# Patient Record
Sex: Male | Born: 1955 | Race: White | Hispanic: No | Marital: Single | Smoking: Never smoker
Health system: Southern US, Community
[De-identification: ages and names within clinical notes are randomized; demographics above are authoritative.]

## PROBLEM LIST (undated history)

## (undated) DIAGNOSIS — E559 Vitamin D deficiency, unspecified: Secondary | ICD-10-CM

## (undated) DIAGNOSIS — M109 Gout, unspecified: Secondary | ICD-10-CM

## (undated) DIAGNOSIS — J45909 Unspecified asthma, uncomplicated: Secondary | ICD-10-CM

## (undated) DIAGNOSIS — E119 Type 2 diabetes mellitus without complications: Secondary | ICD-10-CM

## (undated) DIAGNOSIS — G4730 Sleep apnea, unspecified: Secondary | ICD-10-CM

## (undated) DIAGNOSIS — G4733 Obstructive sleep apnea (adult) (pediatric): Secondary | ICD-10-CM

## (undated) DIAGNOSIS — D649 Anemia, unspecified: Secondary | ICD-10-CM

## (undated) DIAGNOSIS — L719 Rosacea, unspecified: Secondary | ICD-10-CM

## (undated) DIAGNOSIS — F54 Psychological and behavioral factors associated with disorders or diseases classified elsewhere: Secondary | ICD-10-CM

## (undated) DIAGNOSIS — R631 Polydipsia: Secondary | ICD-10-CM

## (undated) DIAGNOSIS — F209 Schizophrenia, unspecified: Secondary | ICD-10-CM

## (undated) DIAGNOSIS — K219 Gastro-esophageal reflux disease without esophagitis: Secondary | ICD-10-CM

## (undated) DIAGNOSIS — F32A Depression, unspecified: Secondary | ICD-10-CM

## (undated) DIAGNOSIS — R1310 Dysphagia, unspecified: Secondary | ICD-10-CM

## (undated) DIAGNOSIS — G709 Myoneural disorder, unspecified: Secondary | ICD-10-CM

## (undated) DIAGNOSIS — I82890 Acute embolism and thrombosis of other specified veins: Secondary | ICD-10-CM

## (undated) DIAGNOSIS — E785 Hyperlipidemia, unspecified: Secondary | ICD-10-CM

## (undated) DIAGNOSIS — E039 Hypothyroidism, unspecified: Secondary | ICD-10-CM

## (undated) DIAGNOSIS — F329 Major depressive disorder, single episode, unspecified: Secondary | ICD-10-CM

## (undated) DIAGNOSIS — Z8719 Personal history of other diseases of the digestive system: Secondary | ICD-10-CM

## (undated) DIAGNOSIS — K2270 Barrett's esophagus without dysplasia: Secondary | ICD-10-CM

## (undated) DIAGNOSIS — IMO0001 Reserved for inherently not codable concepts without codable children: Secondary | ICD-10-CM

## (undated) DIAGNOSIS — R7989 Other specified abnormal findings of blood chemistry: Secondary | ICD-10-CM

## (undated) DIAGNOSIS — I8500 Esophageal varices without bleeding: Secondary | ICD-10-CM

## (undated) DIAGNOSIS — I10 Essential (primary) hypertension: Secondary | ICD-10-CM

## (undated) DIAGNOSIS — G4700 Insomnia, unspecified: Secondary | ICD-10-CM

## (undated) DIAGNOSIS — F200 Paranoid schizophrenia: Secondary | ICD-10-CM

## (undated) HISTORY — DX: Obstructive sleep apnea (adult) (pediatric): G47.33

## (undated) HISTORY — PX: EYE SURGERY: SHX253

## (undated) HISTORY — PX: TONSILLECTOMY: SUR1361

## (undated) HISTORY — PX: ESOPHAGOGASTRODUODENOSCOPY: SHX1529

## (undated) HISTORY — PX: COLONOSCOPY: SHX174

---

## 2005-01-17 ENCOUNTER — Ambulatory Visit: Payer: Self-pay | Admitting: Internal Medicine

## 2007-03-19 ENCOUNTER — Ambulatory Visit: Payer: Self-pay | Admitting: Otolaryngology

## 2007-03-19 ENCOUNTER — Other Ambulatory Visit: Payer: Self-pay

## 2007-03-26 ENCOUNTER — Ambulatory Visit: Payer: Self-pay | Admitting: Otolaryngology

## 2010-12-20 ENCOUNTER — Ambulatory Visit: Payer: Self-pay | Admitting: Gastroenterology

## 2011-03-26 ENCOUNTER — Ambulatory Visit: Payer: Self-pay | Admitting: Internal Medicine

## 2011-05-26 LAB — ACETAMINOPHEN LEVEL: Acetaminophen: <2 ug/mL

## 2011-05-26 LAB — COMPREHENSIVE METABOLIC PANEL
Albumin: 4.4 g/dL (ref 3.4–5.0)
Alkaline Phosphatase: 106 U/L (ref 50–136)
Bilirubin,Total: 0.4 mg/dL (ref 0.2–1.0)
Chloride: 85 mmol/L — ABNORMAL LOW (ref 98–107)
Creatinine: 0.88 mg/dL (ref 0.60–1.30)
Glucose: 258 mg/dL — ABNORMAL HIGH (ref 65–99)
Sodium: 125 mmol/L — ABNORMAL LOW (ref 136–145)
Total Protein: 7.4 g/dL (ref 6.4–8.2)

## 2011-05-26 LAB — DRUG SCREEN, URINE
Barbiturates, Ur Screen: NEGATIVE (ref ?–200)
Benzodiazepine, Ur Scrn: NEGATIVE (ref ?–200)
Cannabinoid 50 Ng, Ur ~~LOC~~: NEGATIVE (ref ?–50)
MDMA (Ecstasy)Ur Screen: NEGATIVE (ref ?–500)
Opiate, Ur Screen: NEGATIVE (ref ?–300)
Phencyclidine (PCP) Ur S: NEGATIVE (ref ?–25)

## 2011-05-26 LAB — CBC
HCT: 38.7 % — ABNORMAL LOW (ref 40.0–52.0)
MCH: 29.5 pg (ref 26.0–34.0)
MCHC: 32.9 g/dL (ref 32.0–36.0)
MCV: 90 fL (ref 80–100)
RBC: 4.31 10*6/uL — ABNORMAL LOW (ref 4.40–5.90)
RDW: 14.5 % (ref 11.5–14.5)

## 2011-05-26 LAB — ETHANOL
Ethanol %: <0.003 % (ref 0.000–0.080)
Ethanol: <3 mg/dL

## 2011-05-26 LAB — SALICYLATE LEVEL: Salicylates, Serum: <1.7 mg/dL

## 2011-05-28 ENCOUNTER — Inpatient Hospital Stay: Payer: Self-pay | Admitting: Psychiatry

## 2011-06-02 LAB — BASIC METABOLIC PANEL
Anion Gap: 11 (ref 7–16)
BUN: 13 mg/dL (ref 7–18)
Calcium, Total: 8.5 mg/dL (ref 8.5–10.1)
Chloride: 92 mmol/L — ABNORMAL LOW (ref 98–107)
Co2: 28 mmol/L (ref 21–32)
Creatinine: 0.94 mg/dL (ref 0.60–1.30)
EGFR (African American): >60
Sodium: 131 mmol/L — ABNORMAL LOW (ref 136–145)

## 2011-06-03 LAB — BASIC METABOLIC PANEL
BUN: 17 mg/dL (ref 7–18)
Calcium, Total: 9.6 mg/dL (ref 8.5–10.1)
Chloride: 96 mmol/L — ABNORMAL LOW (ref 98–107)
Creatinine: 0.88 mg/dL (ref 0.60–1.30)
Osmolality: 278 (ref 275–301)
Potassium: 4.3 mmol/L (ref 3.5–5.1)

## 2011-06-03 LAB — GLUCOSE, RANDOM
Glucose: 421 mg/dL — ABNORMAL HIGH (ref 65–99)
Glucose: 447 mg/dL — ABNORMAL HIGH (ref 65–99)

## 2011-06-11 LAB — COMPREHENSIVE METABOLIC PANEL
Albumin: 3.5 g/dL (ref 3.4–5.0)
BUN: 12 mg/dL (ref 7–18)
Bilirubin,Total: 0.3 mg/dL (ref 0.2–1.0)
Chloride: 77 mmol/L — ABNORMAL LOW (ref 98–107)
Co2: 26 mmol/L (ref 21–32)
Creatinine: 0.69 mg/dL (ref 0.60–1.30)
EGFR (African American): >60
EGFR (Non-African Amer.): >60
Glucose: 272 mg/dL — ABNORMAL HIGH (ref 65–99)
Osmolality: 240 (ref 275–301)
Potassium: 4.6 mmol/L (ref 3.5–5.1)
SGPT (ALT): 67 U/L
Sodium: 114 mmol/L — CL (ref 136–145)
Total Protein: 6.9 g/dL (ref 6.4–8.2)

## 2011-06-11 LAB — CBC
HCT: 35.1 % — ABNORMAL LOW (ref 40.0–52.0)
MCV: 88 fL (ref 80–100)
Platelet: 262 10*3/uL (ref 150–440)
RDW: 14.6 % — ABNORMAL HIGH (ref 11.5–14.5)
WBC: 9 10*3/uL (ref 3.8–10.6)

## 2011-06-11 LAB — SODIUM, URINE, RANDOM: Sodium, Urine Random: 50 mmol/L (ref 20–110)

## 2011-06-11 LAB — SODIUM: Sodium: 122 mmol/L — ABNORMAL LOW (ref 136–145)

## 2011-06-12 LAB — BASIC METABOLIC PANEL
Calcium, Total: 8.5 mg/dL (ref 8.5–10.1)
Chloride: 89 mmol/L — ABNORMAL LOW (ref 98–107)
Co2: 27 mmol/L (ref 21–32)
Creatinine: 0.82 mg/dL (ref 0.60–1.30)
Glucose: 231 mg/dL — ABNORMAL HIGH (ref 65–99)
Potassium: 4.7 mmol/L (ref 3.5–5.1)
Sodium: 126 mmol/L — ABNORMAL LOW (ref 136–145)

## 2011-06-13 LAB — BASIC METABOLIC PANEL
Calcium, Total: 8.7 mg/dL (ref 8.5–10.1)
Chloride: 86 mmol/L — ABNORMAL LOW (ref 98–107)
EGFR (Non-African Amer.): >60
Glucose: 211 mg/dL — ABNORMAL HIGH (ref 65–99)
Osmolality: 257 (ref 275–301)
Potassium: 5.4 mmol/L — ABNORMAL HIGH (ref 3.5–5.1)
Sodium: 124 mmol/L — ABNORMAL LOW (ref 136–145)

## 2011-06-28 ENCOUNTER — Emergency Department: Payer: Self-pay | Admitting: Emergency Medicine

## 2011-06-29 ENCOUNTER — Emergency Department: Payer: Self-pay | Admitting: Emergency Medicine

## 2011-06-29 LAB — CBC
HCT: 36.6 % — ABNORMAL LOW (ref 40.0–52.0)
MCH: 29 pg (ref 26.0–34.0)
MCV: 88 fL (ref 80–100)
Platelet: 198 10*3/uL (ref 150–440)
RDW: 14.6 % — ABNORMAL HIGH (ref 11.5–14.5)
WBC: 8.5 10*3/uL (ref 3.8–10.6)

## 2011-06-29 LAB — COMPREHENSIVE METABOLIC PANEL
Alkaline Phosphatase: 136 U/L (ref 50–136)
Anion Gap: 9 (ref 7–16)
BUN: 11 mg/dL (ref 7–18)
Bilirubin,Total: 0.5 mg/dL (ref 0.2–1.0)
Calcium, Total: 8.6 mg/dL (ref 8.5–10.1)
Co2: 26 mmol/L (ref 21–32)
Creatinine: 0.65 mg/dL (ref 0.60–1.30)
EGFR (African American): >60
EGFR (Non-African Amer.): >60
SGOT(AST): 40 U/L — ABNORMAL HIGH (ref 15–37)
SGPT (ALT): 30 U/L

## 2012-05-01 ENCOUNTER — Ambulatory Visit: Payer: Self-pay | Admitting: Gastroenterology

## 2012-08-26 ENCOUNTER — Ambulatory Visit: Payer: Self-pay | Admitting: Gastroenterology

## 2013-02-04 DIAGNOSIS — F200 Paranoid schizophrenia: Secondary | ICD-10-CM | POA: Insufficient documentation

## 2013-02-05 DIAGNOSIS — K2270 Barrett's esophagus without dysplasia: Secondary | ICD-10-CM | POA: Insufficient documentation

## 2013-02-05 DIAGNOSIS — K861 Other chronic pancreatitis: Secondary | ICD-10-CM | POA: Insufficient documentation

## 2013-02-05 DIAGNOSIS — I10 Essential (primary) hypertension: Secondary | ICD-10-CM | POA: Insufficient documentation

## 2013-02-05 DIAGNOSIS — E119 Type 2 diabetes mellitus without complications: Secondary | ICD-10-CM | POA: Insufficient documentation

## 2013-02-05 DIAGNOSIS — E039 Hypothyroidism, unspecified: Secondary | ICD-10-CM | POA: Insufficient documentation

## 2013-03-25 ENCOUNTER — Ambulatory Visit: Payer: Self-pay | Admitting: Gastroenterology

## 2013-04-17 ENCOUNTER — Ambulatory Visit: Payer: Self-pay | Admitting: Gastroenterology

## 2013-08-24 ENCOUNTER — Ambulatory Visit: Payer: Self-pay | Admitting: Otolaryngology

## 2013-09-02 ENCOUNTER — Ambulatory Visit: Payer: Self-pay | Admitting: Internal Medicine

## 2013-09-30 ENCOUNTER — Ambulatory Visit: Payer: Self-pay | Admitting: Family Medicine

## 2013-12-22 ENCOUNTER — Ambulatory Visit: Payer: Self-pay | Admitting: Internal Medicine

## 2014-01-12 ENCOUNTER — Ambulatory Visit: Payer: Self-pay | Admitting: Gastroenterology

## 2014-01-12 LAB — PROTIME-INR
INR: 1
Prothrombin Time: 12.8 secs (ref 11.5–14.7)

## 2014-01-12 LAB — PLATELET COUNT: Platelet: 231 10*3/uL (ref 150–440)

## 2014-01-14 LAB — PATHOLOGY REPORT

## 2014-01-21 ENCOUNTER — Ambulatory Visit: Payer: Self-pay | Admitting: Internal Medicine

## 2014-03-02 ENCOUNTER — Ambulatory Visit: Payer: Self-pay | Admitting: Gastroenterology

## 2014-04-07 ENCOUNTER — Encounter: Payer: Self-pay | Admitting: Otolaryngology

## 2014-04-09 ENCOUNTER — Encounter: Payer: Self-pay | Admitting: Otolaryngology

## 2014-04-22 DIAGNOSIS — K868 Other specified diseases of pancreas: Secondary | ICD-10-CM | POA: Diagnosis not present

## 2014-04-23 DIAGNOSIS — Z79899 Other long term (current) drug therapy: Secondary | ICD-10-CM | POA: Diagnosis not present

## 2014-04-26 DIAGNOSIS — Z1389 Encounter for screening for other disorder: Secondary | ICD-10-CM | POA: Diagnosis not present

## 2014-04-26 DIAGNOSIS — Z79899 Other long term (current) drug therapy: Secondary | ICD-10-CM | POA: Diagnosis not present

## 2014-05-18 DIAGNOSIS — Z79899 Other long term (current) drug therapy: Secondary | ICD-10-CM | POA: Diagnosis not present

## 2014-05-21 DIAGNOSIS — K861 Other chronic pancreatitis: Secondary | ICD-10-CM | POA: Diagnosis not present

## 2014-05-21 DIAGNOSIS — F200 Paranoid schizophrenia: Secondary | ICD-10-CM | POA: Diagnosis not present

## 2014-05-21 DIAGNOSIS — E118 Type 2 diabetes mellitus with unspecified complications: Secondary | ICD-10-CM | POA: Diagnosis not present

## 2014-05-25 DIAGNOSIS — F200 Paranoid schizophrenia: Secondary | ICD-10-CM | POA: Diagnosis not present

## 2014-05-25 DIAGNOSIS — E118 Type 2 diabetes mellitus with unspecified complications: Secondary | ICD-10-CM | POA: Diagnosis not present

## 2014-05-25 DIAGNOSIS — K861 Other chronic pancreatitis: Secondary | ICD-10-CM | POA: Diagnosis not present

## 2014-05-25 DIAGNOSIS — E039 Hypothyroidism, unspecified: Secondary | ICD-10-CM | POA: Diagnosis not present

## 2014-06-14 DIAGNOSIS — Z79899 Other long term (current) drug therapy: Secondary | ICD-10-CM | POA: Diagnosis not present

## 2014-07-05 DIAGNOSIS — G4722 Circadian rhythm sleep disorder, advanced sleep phase type: Secondary | ICD-10-CM | POA: Diagnosis not present

## 2014-07-05 DIAGNOSIS — J449 Chronic obstructive pulmonary disease, unspecified: Secondary | ICD-10-CM | POA: Diagnosis not present

## 2014-07-05 DIAGNOSIS — R911 Solitary pulmonary nodule: Secondary | ICD-10-CM | POA: Diagnosis not present

## 2014-07-05 DIAGNOSIS — K2270 Barrett's esophagus without dysplasia: Secondary | ICD-10-CM | POA: Diagnosis not present

## 2014-07-08 DIAGNOSIS — Z79899 Other long term (current) drug therapy: Secondary | ICD-10-CM | POA: Diagnosis not present

## 2014-07-09 DIAGNOSIS — E119 Type 2 diabetes mellitus without complications: Secondary | ICD-10-CM | POA: Diagnosis not present

## 2014-07-29 DIAGNOSIS — Z79899 Other long term (current) drug therapy: Secondary | ICD-10-CM | POA: Diagnosis not present

## 2014-08-01 NOTE — Consult Note (Signed)
PATIENT NAME:  Dalton Thomas, Dalton Thomas MR#:  [id number] DATE OF BIRTH:  23-Sep-1955  DATE OF CONSULTATION:  06/02/2011  REFERRING PHYSICIAN:  Caryn Section, MD CONSULTING PHYSICIAN:  Shaune Pollack, MD  PRIMARY CARE PHYSICIAN:  Dr. Evelene Croon.  REASON FOR CONSULTATION: Elevated blood sugar.   HISTORY: The patient is a 59 year old Caucasian male with a history of diabetes, schizophrenia, was admitted for paranoid schizophrenia and is being treated in psych unit. The patient was noted to have a high blood sugar about 400 to 500. The patient's blood sugar after lunchtime today is about 250. The patient has no complaints He denies any polyuria or polydipsia, urinary frequency. No heat or cold intolerance. No chest pain, palpitations, orthopnea. No exertional dyspnea. No weakness.   PAST MEDICAL HISTORY:   1. Diabetes. 2. Schizophrenia. 3. Possible hypothyroidism due to the patient is taking levothyroxine, but the patient denies.   PAST SURGICAL HISTORY:  None.   FAMILY HISTORY: Diabetes.   SOCIAL HISTORY: Denies any smoking, alcohol drinking, or illicit drugs.   CURRENT MEDICATIONS:  1. Glyburide/metformin 5/500 mg p.o. daily.  2. Januvia 100 mg p.o. daily.  3. Aspirin 81 mg p.o. daily.  4. Nexium 40 mg p.o. daily.  5. Levothyroxine 0.05 mg p.o. q. 6 a.m.  6. Metronidazole 0.75% gel apply to affected area.  7. Trazodone 100 mg p.o. at bedtime.  8. Acetaminophen, Tylenol #3, 650 mg p.o. 4 hours p.r.n. for pain and headache.  9. Alum-Mag hydrox with simethicone 400/400/40 milligrams/5 mL suspension, 30 mL p.o. every four hours p.r.n. for indigestion, heartburn.  10. Magnesium hydroxide suspension 30 mL p.o. at bedtime p.r.n. for constipation.  11. Geodon 80 mg p.o. b.i.d.  12. Petrolatum ointment apply to affected area once. 13. Risperdal 2 mg p.o. b.i.d.  14. Restoril 15 mg p.o. at bedtime. 15. Sliding scale.   REVIEW OF SYSTEMS: CONSTITUTIONAL: The patient denies any fever or chills. No  headache or dizziness. No weakness but has weight loss. HEENT: Pupils: No blurred vision, double vision. No glaucoma, no discharge from ear or nose. No epistaxis. CARDIOVASCULAR: No chest pain, palpitation, orthopnea, or nocturnal dyspnea. No leg edema. GASTROINTESTINAL: No abdominal pain, nausea, vomiting, or diarrhea. No melena or bloody stool. PULMONARY: No cough, sputum, shortness of breath or hemoptysis. ENDOCRINE: No polyuria or polydipsia or hot or cold intolerance. GU: No dysuria, hematuria or incontinence. SKIN: No rash or jaundice. PSYCH: The patient denies any anxiety or depression.   PHYSICAL EXAMINATION:   VITAL SIGNS: Temperature 95.6, pulse 82, respirations 18, blood pressure 136/70.   GENERAL: The patient is alert, awake, oriented in no acute distress.   HEENT: Pupils are round, equal, reactive to light and accommodation. Clear oropharynx. Moist oral mucosa.   NECK: Supple. No JVD or carotid bruits. No lymphadenopathy. No thyromegaly.   CARDIOVASCULAR: S1, S2, regular rate and rhythm. No murmurs or gallops.   PULMONARY: Bilateral air entry. No wheezing or rales.   ABDOMEN: Soft. No distention or tenderness. No organomegaly. Bowel sounds present.   EXTREMITIES: No edema, clubbing, or cyanosis. No calf tenderness. Strong pedal pulses.   SKIN: No rash or jaundice. No bruises.   NEUROLOGY: Alert and oriented x3. No focal deficits. Power five out of five. Sensation intact. Deep tendon reflexes 2+.  LABORATORY, RADIOLOGICAL AND DIAGNOSTIC DATA: Glucose today 297 at 12:00 and 500 at 9:40. BMP shows glucose 446, BUN 13, creatinine 0.94, sodium 131, potassium 5.1, chloride 92, bicarbonate 28, anion gap 11.   IMPRESSION:  1. Uncontrolled  diabetes. 2. Hyperglycemia. 3. Hyponatremia due to hyperglycemia.   RECOMMENDATIONS: The patient has uncontrolled diabetes. I recommend continue sliding scale. Discontinue p.o. diabetes medications and start basal Lantus 16 units subcutaneous at  bedtime and we will adjust Lantus dose depending on the blood sugar. In addition, we will check hemoglobin A1c and also we will give D50 IV p.r.n. if blood sugar less than 60.    ____________________________ Shaune PollackQing Jillyn Stacey, MD qc:ap D: 06/02/2011 14:05:40 ET T: 06/02/2011 14:33:25 ET JOB#: [id number]  cc: Shaune PollackQing Regene Mccarthy, MD, <Dictator> Meindert A. Lacie ScottsNiemeyer, MD Shaune PollackQING Kaiyden Simkin MD ELECTRONICALLY SIGNED 06/03/2011 13:30

## 2014-08-01 NOTE — H&P (Signed)
PATIENT NAME:  Dalton Thomas, Dalton Thomas MR#:  [id number] DATE OF BIRTH:  [birth year]  DATE OF ADMISSION:  05/28/2011  REFERRING PHYSICIAN: Dr. Daryel November ATTENDING PHYSICIAN: Dr. Kristine Linea    IDENTIFYING DATA: Mr. Fauth is a 59 year old male with history of paranoid schizophrenia.   CHIEF COMPLAINT:  "I only agreed to three days, four nights and two meals."   HISTORY OF PRESENT ILLNESS: Mr. Harrower has been a resident of a group home doing very well since 1982 when he was released from [facility] after eight years of hospitalization. He started refusing his medication and became increasingly disorganized, paranoid and delusional. On 02/15 his parents who are guardians took him to see his psychiatrist, Dr. Sherin Quarry, who increased the dose of Geodon, however, the patient did not take any medication at the group home since. He was rather bizarre, disorganized, complaining of voices that he hears for 300 years, refusing food and medication. He was thought to the Emergency Room. In the Emergency Room he was pleasantly confused and with encouragement he took his medications as prescribed. There were no behavioral problems in the Emergency Room. He was transferred to [facility] Medicine Unit. There is no history of alcohol or substance abuse.   PAST PSYCHIATRIC HISTORY: We don't know any details but as above he was hospitalized at [facility] for 7 or 8 years and released in 1982. He has been maintained on a combination of Geodon, Celexa and trazodone in the community with excellent results up until recently. I do not know his history of medication. It is unclear whether he had suicide attempts in the past. Per parents there were none.   FAMILY PSYCHIATRIC HISTORY: None reported.   PAST MEDICAL HISTORY:  1. Diabetes.  2. Gastroesophageal reflux disease. 3. Hypothyroidism.   ALLERGIES: No known drug allergies.   MEDICATIONS ON ADMISSION:  1. Aspirin 81 mg daily.   2. Synthroid 50 mcg daily.  3. Januvia 100 mg daily.  4. Nexium 40 mg daily.  5. Glucovance 5/500 mg twice daily.  6. Celexa 20 mg daily.  7. Trazodone 100 mg at bedtime. 8. MetroGel 1% topical gel apply daily.  9. Geodon 40 mg twice daily.   SOCIAL HISTORY: As above, he is incompetent, his parents are his guardians. He lives in a group home, has been very successful there up until recently. He is disabled.   REVIEW OF SYSTEMS: CONSTITUTIONAL: No fevers or chills. Unknown weight changes. He seems very slender even though he has voracious appetite in the Emergency Room. EYES: No double or blurred vision. ENT: No hearing loss. RESPIRATORY: No shortness of breath or cough. CARDIOVASCULAR: No chest pain or orthopnea. GASTROINTESTINAL: No abdominal pain, nausea, vomiting, or diarrhea. GENITOURINARY: No incontinence or frequency. ENDOCRINE: No heat or cold intolerance. LYMPHATIC: No anemia or easy bruising. INTEGUMENTARY: No acne, rash. MUSCULOSKELETAL: No muscle or joint pain. NEUROLOGIC: No tingling or weakness. PSYCHIATRIC: See history of present illness for details.   PHYSICAL EXAMINATION:  VITAL SIGNS: Blood pressure 157/81, pulse 82, respirations 18, temperature 97.6   GENERAL: This is a emaciated male in no acute distress.   HEENT: The pupils are equal, round, and reactive to light. Sclera anicteric.   NECK: Supple. No thyromegaly.   LUNGS: Clear to auscultation.   HEART: Regular rhythm and rate. No murmurs, rubs, or gallops.   ABDOMEN: Soft, nontender, nondistended. Positive bowel sounds.   MUSCULOSKELETAL: Normal muscle strength in all extremities.   SKIN: No rashes or bruises.   LYMPHATIC:  No cervical adenopathy.   NEUROLOGIC: Cranial nerves II through XII are intact.   LABORATORY, DIAGNOSTIC AND RADIOLOGICAL DATA: Chemistries are within normal limits except for blood glucose 258, low sodium 125. Blood alcohol level zero. LFTs within normal limits. TSH 2.56. Urine tox screen  negative for substances. CBC within normal limits. Serum acetaminophen and salicylates are low.   MENTAL STATUS EXAMINATION ON ADMISSION: The patient is assessed in the Emergency Room. He is pleasant, polite but utterly confused, knows his name but nothing about place, time and situation. He is willing to negotiate with me about the length of stay and his food. He is wearing hospital scrubs. He is marginally groomed. He maintains good eye contact. His speech is slightly pressured. Mood is fine with odd affect. Thought processing is disorganized. He denies thoughts of hurting himself or others. He is delusional and paranoid. He appears to attend to internal stimuli. His cognition is impaired mostly due to psychotic disorganization. His insight and judgment are poor.   SUICIDE RISK ASSESSMENT ON ADMISSION: This is a patient with history of psychosis but reportedly no mood symptoms or suicidal ideation who is unable to take care of himself and requires support and supervision.   DIAGNOSES:  AXIS I: Paranoid schizophrenia.   AXIS II: Deferred.   AXIS III:  1. Hypertension. 2. Diabetes. 3. Gastroesophageal reflux disease.   AXIS IV: Mental illness, treatment compliance, exacerbation of psychosis.   AXIS V: GAF 25.   PLAN: The patient was admitted to [facility] Behavioral Medicine unit for safety, stabilization and medication management. He was initially placed on suicide precautions and was closely monitored for any unsafe behaviors. He underwent full psychiatric and risk assessment. He received pharmacotherapy, individual and group psychotherapy, substance abuse counseling, and support from therapeutic milieu.  1. Psychosis: We restarted him on Geodon. We increased the dose to 80 mg twice daily. He accepts medication and seems to tolerate it well.  2. Medical: We will continue all his medications as prescribed by his primary provider.  3. Disposition: To be established.    ____________________________ Ellin GoodieJolanta B. Jennet MaduroPucilowska, MD jbp:cms D: 05/29/2011 19:29:58 ET T: 05/30/2011 06:29:04 ET JOB#: [id number]  cc: Eual Lindstrom B. Jennet MaduroPucilowska, MD, <Dictator> Shari ProwsJOLANTA B Gill Delrossi MD ELECTRONICALLY SIGNED 06/02/2011 4:36

## 2014-08-01 NOTE — Consult Note (Signed)
Brief Consult Note: Diagnosis: Paranoid schizophrenia.   Comments: Will admit tp BMU.  Electronic Signatures: Kristine LineaPucilowska, Tytiana Coles (MD)  (Signed 18-Feb-13 17:01)  Authored: Brief Consult Note   Last Updated: 18-Feb-13 17:01 by Kristine LineaPucilowska, Shahed Yeoman (MD)

## 2014-08-01 NOTE — Consult Note (Signed)
Brief Consult Note: Diagnosis: Paranoid schizophrenia.   Comments: Will admitt to psychiatry.  Electronic Signatures: Kristine LineaPucilowska, Jolanta (MD)  (Signed 18-Feb-13 13:05)  Authored: Brief Consult Note   Last Updated: 18-Feb-13 13:05 by Kristine LineaPucilowska, Jolanta (MD)

## 2014-08-03 DIAGNOSIS — D509 Iron deficiency anemia, unspecified: Secondary | ICD-10-CM | POA: Diagnosis not present

## 2014-08-03 DIAGNOSIS — I10 Essential (primary) hypertension: Secondary | ICD-10-CM | POA: Diagnosis not present

## 2014-08-03 DIAGNOSIS — K2270 Barrett's esophagus without dysplasia: Secondary | ICD-10-CM | POA: Diagnosis not present

## 2014-08-03 DIAGNOSIS — E1169 Type 2 diabetes mellitus with other specified complication: Secondary | ICD-10-CM | POA: Diagnosis not present

## 2014-08-03 DIAGNOSIS — F209 Schizophrenia, unspecified: Secondary | ICD-10-CM | POA: Diagnosis not present

## 2014-08-04 DIAGNOSIS — H2589 Other age-related cataract: Secondary | ICD-10-CM | POA: Diagnosis not present

## 2014-08-05 DIAGNOSIS — K2270 Barrett's esophagus without dysplasia: Secondary | ICD-10-CM | POA: Diagnosis not present

## 2014-08-05 DIAGNOSIS — K861 Other chronic pancreatitis: Secondary | ICD-10-CM | POA: Diagnosis not present

## 2014-08-05 DIAGNOSIS — K5909 Other constipation: Secondary | ICD-10-CM | POA: Diagnosis not present

## 2014-08-06 DIAGNOSIS — M6281 Muscle weakness (generalized): Secondary | ICD-10-CM | POA: Diagnosis not present

## 2014-08-06 DIAGNOSIS — R262 Difficulty in walking, not elsewhere classified: Secondary | ICD-10-CM | POA: Diagnosis not present

## 2014-08-09 DIAGNOSIS — M6281 Muscle weakness (generalized): Secondary | ICD-10-CM | POA: Diagnosis not present

## 2014-08-09 DIAGNOSIS — R262 Difficulty in walking, not elsewhere classified: Secondary | ICD-10-CM | POA: Diagnosis not present

## 2014-08-11 DIAGNOSIS — R262 Difficulty in walking, not elsewhere classified: Secondary | ICD-10-CM | POA: Diagnosis not present

## 2014-08-11 DIAGNOSIS — M6281 Muscle weakness (generalized): Secondary | ICD-10-CM | POA: Diagnosis not present

## 2014-08-16 DIAGNOSIS — R262 Difficulty in walking, not elsewhere classified: Secondary | ICD-10-CM | POA: Diagnosis not present

## 2014-08-16 DIAGNOSIS — E785 Hyperlipidemia, unspecified: Secondary | ICD-10-CM | POA: Diagnosis not present

## 2014-08-16 DIAGNOSIS — I2510 Atherosclerotic heart disease of native coronary artery without angina pectoris: Secondary | ICD-10-CM | POA: Diagnosis not present

## 2014-08-16 DIAGNOSIS — R5383 Other fatigue: Secondary | ICD-10-CM | POA: Diagnosis not present

## 2014-08-16 DIAGNOSIS — I10 Essential (primary) hypertension: Secondary | ICD-10-CM | POA: Diagnosis not present

## 2014-08-16 DIAGNOSIS — M6281 Muscle weakness (generalized): Secondary | ICD-10-CM | POA: Diagnosis not present

## 2014-08-20 DIAGNOSIS — M6281 Muscle weakness (generalized): Secondary | ICD-10-CM | POA: Diagnosis not present

## 2014-08-20 DIAGNOSIS — R262 Difficulty in walking, not elsewhere classified: Secondary | ICD-10-CM | POA: Diagnosis not present

## 2014-08-25 DIAGNOSIS — E118 Type 2 diabetes mellitus with unspecified complications: Secondary | ICD-10-CM | POA: Diagnosis not present

## 2014-08-25 DIAGNOSIS — K861 Other chronic pancreatitis: Secondary | ICD-10-CM | POA: Diagnosis not present

## 2014-08-25 DIAGNOSIS — E039 Hypothyroidism, unspecified: Secondary | ICD-10-CM | POA: Diagnosis not present

## 2014-08-25 DIAGNOSIS — I10 Essential (primary) hypertension: Secondary | ICD-10-CM | POA: Diagnosis not present

## 2014-08-25 DIAGNOSIS — Z125 Encounter for screening for malignant neoplasm of prostate: Secondary | ICD-10-CM | POA: Diagnosis not present

## 2014-08-25 DIAGNOSIS — F200 Paranoid schizophrenia: Secondary | ICD-10-CM | POA: Diagnosis not present

## 2014-08-25 DIAGNOSIS — K2270 Barrett's esophagus without dysplasia: Secondary | ICD-10-CM | POA: Diagnosis not present

## 2014-08-25 DIAGNOSIS — D509 Iron deficiency anemia, unspecified: Secondary | ICD-10-CM | POA: Diagnosis not present

## 2014-08-25 DIAGNOSIS — E1165 Type 2 diabetes mellitus with hyperglycemia: Secondary | ICD-10-CM | POA: Diagnosis not present

## 2014-08-25 DIAGNOSIS — Z0001 Encounter for general adult medical examination with abnormal findings: Secondary | ICD-10-CM | POA: Diagnosis not present

## 2014-08-31 ENCOUNTER — Encounter
Admission: RE | Admit: 2014-08-31 | Discharge: 2014-08-31 | Disposition: A | Discharge status: Home / Self Care | Payer: Medicare Other | Source: Ambulatory Visit | Attending: Ophthalmology | Admitting: Ophthalmology

## 2014-08-31 DIAGNOSIS — I10 Essential (primary) hypertension: Secondary | ICD-10-CM | POA: Insufficient documentation

## 2014-08-31 DIAGNOSIS — H2589 Other age-related cataract: Secondary | ICD-10-CM | POA: Diagnosis not present

## 2014-08-31 DIAGNOSIS — R262 Difficulty in walking, not elsewhere classified: Secondary | ICD-10-CM | POA: Diagnosis not present

## 2014-08-31 DIAGNOSIS — Z01812 Encounter for preprocedural laboratory examination: Secondary | ICD-10-CM | POA: Diagnosis not present

## 2014-08-31 DIAGNOSIS — Z01810 Encounter for preprocedural cardiovascular examination: Secondary | ICD-10-CM | POA: Diagnosis not present

## 2014-08-31 DIAGNOSIS — M6281 Muscle weakness (generalized): Secondary | ICD-10-CM | POA: Diagnosis not present

## 2014-08-31 LAB — HEMOGLOBIN: Hemoglobin: 14 g/dL (ref 13.0–18.0)

## 2014-09-04 DIAGNOSIS — G4733 Obstructive sleep apnea (adult) (pediatric): Secondary | ICD-10-CM | POA: Diagnosis not present

## 2014-09-07 ENCOUNTER — Ambulatory Visit: Payer: Medicare Other | Admitting: Certified Registered"

## 2014-09-07 ENCOUNTER — Encounter
Admission: RE | Disposition: A | Discharge status: Home / Self Care | Payer: Self-pay | Source: Ambulatory Visit | Attending: Ophthalmology

## 2014-09-07 ENCOUNTER — Ambulatory Visit
Admission: RE | Admit: 2014-09-07 | Discharge: 2014-09-07 | Disposition: A | Discharge status: Home / Self Care | Payer: Medicare Other | Source: Ambulatory Visit | Attending: Ophthalmology | Admitting: Ophthalmology

## 2014-09-07 ENCOUNTER — Encounter: Payer: Self-pay | Admitting: *Deleted

## 2014-09-07 DIAGNOSIS — I10 Essential (primary) hypertension: Secondary | ICD-10-CM | POA: Insufficient documentation

## 2014-09-07 DIAGNOSIS — E119 Type 2 diabetes mellitus without complications: Secondary | ICD-10-CM | POA: Insufficient documentation

## 2014-09-07 DIAGNOSIS — F209 Schizophrenia, unspecified: Secondary | ICD-10-CM | POA: Diagnosis not present

## 2014-09-07 DIAGNOSIS — H2589 Other age-related cataract: Secondary | ICD-10-CM | POA: Diagnosis not present

## 2014-09-07 DIAGNOSIS — G4730 Sleep apnea, unspecified: Secondary | ICD-10-CM | POA: Diagnosis not present

## 2014-09-07 DIAGNOSIS — M1990 Unspecified osteoarthritis, unspecified site: Secondary | ICD-10-CM | POA: Insufficient documentation

## 2014-09-07 DIAGNOSIS — D649 Anemia, unspecified: Secondary | ICD-10-CM | POA: Diagnosis not present

## 2014-09-07 DIAGNOSIS — L719 Rosacea, unspecified: Secondary | ICD-10-CM | POA: Diagnosis not present

## 2014-09-07 DIAGNOSIS — H2511 Age-related nuclear cataract, right eye: Secondary | ICD-10-CM | POA: Diagnosis not present

## 2014-09-07 DIAGNOSIS — K861 Other chronic pancreatitis: Secondary | ICD-10-CM | POA: Diagnosis not present

## 2014-09-07 DIAGNOSIS — F329 Major depressive disorder, single episode, unspecified: Secondary | ICD-10-CM | POA: Insufficient documentation

## 2014-09-07 DIAGNOSIS — K219 Gastro-esophageal reflux disease without esophagitis: Secondary | ICD-10-CM | POA: Insufficient documentation

## 2014-09-07 DIAGNOSIS — R1310 Dysphagia, unspecified: Secondary | ICD-10-CM | POA: Insufficient documentation

## 2014-09-07 DIAGNOSIS — R631 Polydipsia: Secondary | ICD-10-CM | POA: Insufficient documentation

## 2014-09-07 DIAGNOSIS — K2270 Barrett's esophagus without dysplasia: Secondary | ICD-10-CM | POA: Diagnosis not present

## 2014-09-07 DIAGNOSIS — E780 Pure hypercholesterolemia: Secondary | ICD-10-CM | POA: Diagnosis not present

## 2014-09-07 DIAGNOSIS — E039 Hypothyroidism, unspecified: Secondary | ICD-10-CM | POA: Insufficient documentation

## 2014-09-07 HISTORY — DX: Major depressive disorder, single episode, unspecified: F32.9

## 2014-09-07 HISTORY — DX: Reserved for inherently not codable concepts without codable children: IMO0001

## 2014-09-07 HISTORY — DX: Myoneural disorder, unspecified: G70.9

## 2014-09-07 HISTORY — DX: Depression, unspecified: F32.A

## 2014-09-07 HISTORY — DX: Sleep apnea, unspecified: G47.30

## 2014-09-07 HISTORY — DX: Essential (primary) hypertension: I10

## 2014-09-07 HISTORY — PX: CATARACT EXTRACTION W/PHACO: SHX586

## 2014-09-07 HISTORY — DX: Hypothyroidism, unspecified: E03.9

## 2014-09-07 HISTORY — DX: Gastro-esophageal reflux disease without esophagitis: K21.9

## 2014-09-07 SURGERY — PHACOEMULSIFICATION, CATARACT, WITH IOL INSERTION
Anesthesia: Monitor Anesthesia Care | Site: Eye | Laterality: Right | Wound class: Clean

## 2014-09-07 MED ORDER — POVIDONE-IODINE 5 % OP SOLN
OPHTHALMIC | Status: AC
  Administered 2014-09-07: 1 via OPHTHALMIC
  Filled 2014-09-07: qty 30

## 2014-09-07 MED ORDER — TRYPAN BLUE 0.06 % OP SOLN
OPHTHALMIC | Status: AC
  Filled 2014-09-07: qty 0.5

## 2014-09-07 MED ORDER — CARBACHOL 0.01 % IO SOLN
INTRAOCULAR | Status: DC | PRN
  Administered 2014-09-07: 0.5 mL via INTRAOCULAR

## 2014-09-07 MED ORDER — SODIUM CHLORIDE 0.9 % IV SOLN
INTRAVENOUS | Status: DC
  Administered 2014-09-07: 11:00:00 via INTRAVENOUS

## 2014-09-07 MED ORDER — MOXIFLOXACIN HCL 0.5 % OP SOLN - NO CHARGE
OPHTHALMIC | Status: DC | PRN
  Administered 2014-09-07: 1 [drp]

## 2014-09-07 MED ORDER — MIDAZOLAM HCL 2 MG/2ML IJ SOLN
INTRAMUSCULAR | Status: DC | PRN
  Administered 2014-09-07: 2 mg via INTRAVENOUS

## 2014-09-07 MED ORDER — MOXIFLOXACIN HCL 0.5 % OP SOLN
OPHTHALMIC | Status: AC
  Filled 2014-09-07: qty 3

## 2014-09-07 MED ORDER — EPINEPHRINE HCL 1 MG/ML IJ SOLN
INTRAMUSCULAR | Status: AC
Start: 2014-09-07 — End: 2014-09-07
  Filled 2014-09-07: qty 2

## 2014-09-07 MED ORDER — CEFUROXIME OPHTHALMIC INJECTION 1 MG/0.1 ML
INJECTION | OPHTHALMIC | Status: DC | PRN
  Administered 2014-09-07: 0.1 mL via INTRACAMERAL

## 2014-09-07 MED ORDER — EPINEPHRINE HCL 1 MG/ML IJ SOLN
INTRAOCULAR | Status: DC | PRN
  Administered 2014-09-07: 200 mL

## 2014-09-07 MED ORDER — TETRACAINE HCL 0.5 % OP SOLN
OPHTHALMIC | Status: AC
  Administered 2014-09-07: 1 [drp] via OPHTHALMIC
  Filled 2014-09-07: qty 2

## 2014-09-07 MED ORDER — ARMC OPHTHALMIC DILATING GEL
OPHTHALMIC | Status: AC
  Administered 2014-09-07: 2 via OPHTHALMIC
  Filled 2014-09-07: qty 0.25

## 2014-09-07 MED ORDER — CEFUROXIME OPHTHALMIC INJECTION 1 MG/0.1 ML
INJECTION | OPHTHALMIC | Status: AC
  Filled 2014-09-07: qty 0.1

## 2014-09-07 MED ORDER — LIDOCAINE HCL (PF) 4 % IJ SOLN
INTRAMUSCULAR | Status: AC
  Filled 2014-09-07: qty 5

## 2014-09-07 MED ORDER — NA CHONDROIT SULF-NA HYALURON 40-17 MG/ML IO SOLN
INTRAOCULAR | Status: AC
  Filled 2014-09-07: qty 1

## 2014-09-07 MED ORDER — EPINEPHRINE HCL 1 MG/ML IJ SOLN
INTRAMUSCULAR | Status: DC | PRN
  Administered 2014-09-07: 12:00:00 via OPHTHALMIC

## 2014-09-07 SURGICAL SUPPLY — 23 items
ACTIVE FMS ×3 IMPLANT
CANNULA ANT/CHMB 27GA (MISCELLANEOUS) ×9 IMPLANT
GLOVE BIO SURGEON STRL SZ8 (GLOVE) ×3 IMPLANT
GLOVE BIOGEL M 6.5 STRL (GLOVE) ×3 IMPLANT
GLOVE SURG LX 8.0 MICRO (GLOVE) ×2
GLOVE SURG LX STRL 8.0 MICRO (GLOVE) ×1 IMPLANT
GOWN STRL REUS W/ TWL LRG LVL3 (GOWN DISPOSABLE) ×2 IMPLANT
GOWN STRL REUS W/TWL LRG LVL3 (GOWN DISPOSABLE) ×4
LENS IOL TECNIS 21 (Intraocular Lens) ×1 IMPLANT
LENS IOL TECNIS 21.0 (Intraocular Lens) ×2 IMPLANT
LENS IOL TECNIS MONO 1P 21.0 (Intraocular Lens) ×1 IMPLANT
PACK CATARACT (MISCELLANEOUS) ×3 IMPLANT
PACK CATARACT BRASINGTON LX (MISCELLANEOUS) ×3 IMPLANT
PACK EYE AFTER SURG (MISCELLANEOUS) ×3 IMPLANT
SOL BSS BAG (MISCELLANEOUS) ×3
SOL PREP PVP 2OZ (MISCELLANEOUS) ×3
SOLUTION BSS BAG (MISCELLANEOUS) ×1 IMPLANT
SOLUTION PREP PVP 2OZ (MISCELLANEOUS) ×1 IMPLANT
SYR 3ML LL SCALE MARK (SYRINGE) ×6 IMPLANT
SYR 5ML LL (SYRINGE) ×3 IMPLANT
SYR TB 1ML 27GX1/2 LL (SYRINGE) ×3 IMPLANT
WATER STERILE IRR 1000ML POUR (IV SOLUTION) ×3 IMPLANT
WIPE NON LINTING 3.25X3.25 (MISCELLANEOUS) ×3 IMPLANT

## 2014-09-07 NOTE — Discharge Instructions (Signed)
See cataract post op handout  ° °

## 2014-09-07 NOTE — Anesthesia Postprocedure Evaluation (Signed)
  Anesthesia Post-op Note  Patient: Dalton HammedFred B Taketa Jr.  Procedure(s) Performed: Procedure(s) with comments: CATARACT EXTRACTION PHACO AND INTRAOCULAR LENS PLACEMENT (IOC) (Right) - US 02:16 AP% 24.8 CDE 33.94  Anesthesia type:MAC  Patient location: PACU  Post pain: Pain level controlled  Post assessment: Post-op Vital signs reviewed, Patient's Cardiovascular Status Stable, Respiratory Function Stable, Patent Airway and No signs of Nausea or vomiting  Post vital signs: Reviewed and stable  Last Vitals:  Filed Vitals:   09/07/14 1153  BP: 117/69  Pulse: 61  Temp: 35.8 C  Resp: 18    Level of consciousness: awake, alert  and patient cooperative  Complications: No apparent anesthesia complications

## 2014-09-07 NOTE — Transfer of Care (Signed)
Immediate Anesthesia Transfer of Care Note  Patient: Dalton HammedFred B Kressin Jr.  Procedure(s) Performed: Procedure(s) with comments: CATARACT EXTRACTION PHACO AND INTRAOCULAR LENS PLACEMENT (IOC) (Right) - US 02:16 AP% 24.8 CDE 33.94  Patient Location: Short Stay  Anesthesia Type:MAC  Level of Consciousness: awake  Airway & Oxygen Therapy: Patient Spontanous Breathing  Post-op Assessment: Report given to RN  Post vital signs: Reviewed  Last Vitals:  Filed Vitals:   09/07/14 1153  BP: 117/69  Pulse: 61  Temp: 35.8 C  Resp: 18    Complications: No apparent anesthesia complications

## 2014-09-07 NOTE — Anesthesia Preprocedure Evaluation (Signed)
Anesthesia Evaluation  Patient identified by MRN, date of birth, ID band Patient awake    Reviewed: Allergy & Precautions, NPO status , Unable to perform ROS - Chart review only  Airway Mallampati: III  TM Distance: >3 FB Neck ROM: Full    Dental  (+) Chipped, Partial Upper   Pulmonary shortness of breath and with exertion, sleep apnea ,  breath sounds clear to auscultation  Pulmonary exam normal       Cardiovascular hypertension, Pt. on medications Rhythm:Regular Rate:Normal     Neuro/Psych Depression ? Neurologic Dz . Pt just states cataract    GI/Hepatic GERD-  Controlled,  Endo/Other  Hypothyroidism   Renal/GU Renal disease  negative genitourinary   Musculoskeletal negative musculoskeletal ROS (+)   Abdominal Normal abdominal exam  (+)   Peds negative pediatric ROS (+)  Hematology negative hematology ROS (+)   Anesthesia Other Findings Med list includes:   Melatonin, Trazodone, clozapine, ss insulin, simvastatin, lopressor  Reproductive/Obstetrics negative OB ROS                             Anesthesia Physical Anesthesia Plan  ASA: III  Anesthesia Plan: MAC   Post-op Pain Management:    Induction: Intravenous  Airway Management Planned: Nasal Cannula  Additional Equipment:   Intra-op Plan:   Post-operative Plan:   Informed Consent: I have reviewed the patients History and Physical, chart, labs and discussed the procedure including the risks, benefits and alternatives for the proposed anesthesia with the patient or authorized representative who has indicated his/her understanding and acceptance.   Dental advisory given  Plan Discussed with: CRNA and Surgeon  Anesthesia Plan Comments:         Anesthesia Quick Evaluation

## 2014-09-07 NOTE — Op Note (Signed)
PREOPERATIVE DIAGNOSIS:  Nuclear sclerotic cataract of the right eye.   POSTOPERATIVE DIAGNOSIS: same   OPERATIVE PROCEDURE:  Procedure(s): CATARACT EXTRACTION PHACO AND INTRAOCULAR LENS PLACEMENT (IOC)   SURGEON:  Dalton ManilaWilliam Jemarcus Dougal, MD.   ANESTHESIA:  Anesthesiologist: Yves DillPaul Carroll, MD CRNA: Mathews ArgyleBenjamin Logan, CRNA  1.      Managed anesthesia care. 2.      Topical tetracaine drops followed by 2% Xylocaine jelly applied in the preoperative holding area.   COMPLICATIONS:  None.   TECHNIQUE:   Stop and chop   DESCRIPTION OF PROCEDURE:  The patient was examined and consented in the preoperative holding area where the aforementioned topical anesthesia was applied to the right eye and then brought back to the Operating Room where the right eye was prepped and draped in the usual sterile ophthalmic fashion and a lid speculum was placed. A paracentesis was created with the side port blade and the anterior chamber was filled with viscoelastic. A near clear corneal incision was performed with the steel keratome. A continuous curvilinear capsulorrhexis was performed with a cystotome followed by the capsulorrhexis forceps. Hydrodissection and hydrodelineation were carried out with BSS on a blunt cannula. The lens was removed in a stop and chop  technique and the remaining cortical material was removed with the irrigation-aspiration handpiece. The capsular bag was inflated with viscoelastic and the Technis ZCB00  lens was placed in the capsular bag without complication. The remaining viscoelastic was removed from the eye with the irrigation-aspiration handpiece. The wounds were hydrated. The anterior chamber was flushed with Miostat and the eye was inflated to physiologic pressure. 0.1 mL of cefuroxime concentration 10 mg/mL was placed in the anterior chamber. The wounds were found to be water tight. The eye was dressed with Vigamox. The patient was given protective glasses to wear throughout the day and a  shield with which to sleep tonight. The patient was also given drops with which to begin a drop regimen today and will follow-up with me in one day.  Implant Name Type Inv. Item Serial No. Manufacturer Lot No. LRB No. Used  LENS IMPL INTRAOC ZCB00 21.0 - [id number] Intraocular Lens LENS IMPL INTRAOC ZCB00 21.0 [id number]   Right 1   Procedure(s) with comments: CATARACT EXTRACTION PHACO AND INTRAOCULAR LENS PLACEMENT (IOC) (Right) - US 02:16 AP% 24.8 CDE 33.94  Electronically signed: Demaris Leavell LOUIS 09/07/2014 11:50 AM

## 2014-09-17 DIAGNOSIS — G2581 Restless legs syndrome: Secondary | ICD-10-CM | POA: Diagnosis not present

## 2014-09-17 DIAGNOSIS — I2510 Atherosclerotic heart disease of native coronary artery without angina pectoris: Secondary | ICD-10-CM | POA: Diagnosis not present

## 2014-09-17 DIAGNOSIS — I10 Essential (primary) hypertension: Secondary | ICD-10-CM | POA: Diagnosis not present

## 2014-09-28 DIAGNOSIS — Z79899 Other long term (current) drug therapy: Secondary | ICD-10-CM | POA: Diagnosis not present

## 2014-10-07 DIAGNOSIS — M9903 Segmental and somatic dysfunction of lumbar region: Secondary | ICD-10-CM | POA: Diagnosis not present

## 2014-10-07 DIAGNOSIS — M5416 Radiculopathy, lumbar region: Secondary | ICD-10-CM | POA: Diagnosis not present

## 2014-10-07 DIAGNOSIS — I10 Essential (primary) hypertension: Secondary | ICD-10-CM | POA: Diagnosis not present

## 2014-10-07 DIAGNOSIS — E871 Hypo-osmolality and hyponatremia: Secondary | ICD-10-CM | POA: Diagnosis not present

## 2014-10-07 DIAGNOSIS — M9901 Segmental and somatic dysfunction of cervical region: Secondary | ICD-10-CM | POA: Diagnosis not present

## 2014-10-07 DIAGNOSIS — M5030 Other cervical disc degeneration, unspecified cervical region: Secondary | ICD-10-CM | POA: Diagnosis not present

## 2014-10-08 DIAGNOSIS — Z961 Presence of intraocular lens: Secondary | ICD-10-CM | POA: Diagnosis not present

## 2014-10-18 DIAGNOSIS — E785 Hyperlipidemia, unspecified: Secondary | ICD-10-CM | POA: Diagnosis not present

## 2014-10-18 DIAGNOSIS — I2510 Atherosclerotic heart disease of native coronary artery without angina pectoris: Secondary | ICD-10-CM | POA: Diagnosis not present

## 2014-10-18 DIAGNOSIS — I10 Essential (primary) hypertension: Secondary | ICD-10-CM | POA: Diagnosis not present

## 2014-10-28 DIAGNOSIS — Z79899 Other long term (current) drug therapy: Secondary | ICD-10-CM | POA: Diagnosis not present

## 2014-11-05 DIAGNOSIS — M5416 Radiculopathy, lumbar region: Secondary | ICD-10-CM | POA: Diagnosis not present

## 2014-11-05 DIAGNOSIS — M5030 Other cervical disc degeneration, unspecified cervical region: Secondary | ICD-10-CM | POA: Diagnosis not present

## 2014-11-05 DIAGNOSIS — M9903 Segmental and somatic dysfunction of lumbar region: Secondary | ICD-10-CM | POA: Diagnosis not present

## 2014-11-05 DIAGNOSIS — M9901 Segmental and somatic dysfunction of cervical region: Secondary | ICD-10-CM | POA: Diagnosis not present

## 2014-11-09 DIAGNOSIS — F200 Paranoid schizophrenia: Secondary | ICD-10-CM | POA: Diagnosis not present

## 2014-11-09 DIAGNOSIS — Z79899 Other long term (current) drug therapy: Secondary | ICD-10-CM | POA: Diagnosis not present

## 2014-11-19 DIAGNOSIS — Z79899 Other long term (current) drug therapy: Secondary | ICD-10-CM | POA: Diagnosis not present

## 2014-11-29 DIAGNOSIS — K861 Other chronic pancreatitis: Secondary | ICD-10-CM | POA: Diagnosis not present

## 2014-11-29 DIAGNOSIS — E118 Type 2 diabetes mellitus with unspecified complications: Secondary | ICD-10-CM | POA: Diagnosis not present

## 2014-11-29 DIAGNOSIS — I10 Essential (primary) hypertension: Secondary | ICD-10-CM | POA: Diagnosis not present

## 2014-11-29 DIAGNOSIS — F200 Paranoid schizophrenia: Secondary | ICD-10-CM | POA: Diagnosis not present

## 2014-11-29 DIAGNOSIS — E039 Hypothyroidism, unspecified: Secondary | ICD-10-CM | POA: Diagnosis not present

## 2014-12-02 DIAGNOSIS — M9901 Segmental and somatic dysfunction of cervical region: Secondary | ICD-10-CM | POA: Diagnosis not present

## 2014-12-02 DIAGNOSIS — M5030 Other cervical disc degeneration, unspecified cervical region: Secondary | ICD-10-CM | POA: Diagnosis not present

## 2014-12-02 DIAGNOSIS — M9903 Segmental and somatic dysfunction of lumbar region: Secondary | ICD-10-CM | POA: Diagnosis not present

## 2014-12-02 DIAGNOSIS — M5416 Radiculopathy, lumbar region: Secondary | ICD-10-CM | POA: Diagnosis not present

## 2014-12-16 DIAGNOSIS — Z79899 Other long term (current) drug therapy: Secondary | ICD-10-CM | POA: Diagnosis not present

## 2014-12-24 DIAGNOSIS — E1169 Type 2 diabetes mellitus with other specified complication: Secondary | ICD-10-CM | POA: Diagnosis not present

## 2014-12-24 DIAGNOSIS — Z0000 Encounter for general adult medical examination without abnormal findings: Secondary | ICD-10-CM | POA: Diagnosis not present

## 2014-12-24 DIAGNOSIS — D509 Iron deficiency anemia, unspecified: Secondary | ICD-10-CM | POA: Diagnosis not present

## 2014-12-24 DIAGNOSIS — E039 Hypothyroidism, unspecified: Secondary | ICD-10-CM | POA: Diagnosis not present

## 2014-12-24 DIAGNOSIS — I864 Gastric varices: Secondary | ICD-10-CM | POA: Diagnosis not present

## 2014-12-24 DIAGNOSIS — Z0001 Encounter for general adult medical examination with abnormal findings: Secondary | ICD-10-CM | POA: Diagnosis not present

## 2014-12-24 DIAGNOSIS — I10 Essential (primary) hypertension: Secondary | ICD-10-CM | POA: Diagnosis not present

## 2015-01-05 ENCOUNTER — Other Ambulatory Visit: Payer: Self-pay | Admitting: Physician Assistant

## 2015-01-05 DIAGNOSIS — R918 Other nonspecific abnormal finding of lung field: Secondary | ICD-10-CM

## 2015-01-11 ENCOUNTER — Ambulatory Visit
Admission: RE | Admit: 2015-01-11 | Discharge: 2015-01-11 | Disposition: A | Discharge status: Home / Self Care | Payer: Medicare Other | Source: Ambulatory Visit | Attending: Physician Assistant | Admitting: Physician Assistant

## 2015-01-11 ENCOUNTER — Other Ambulatory Visit
Admission: RE | Admit: 2015-01-11 | Discharge: 2015-01-11 | Disposition: A | Discharge status: Home / Self Care | Payer: Medicare Other | Source: Ambulatory Visit | Attending: Physician Assistant | Admitting: Physician Assistant

## 2015-01-11 DIAGNOSIS — I2510 Atherosclerotic heart disease of native coronary artery without angina pectoris: Secondary | ICD-10-CM | POA: Insufficient documentation

## 2015-01-11 DIAGNOSIS — R911 Solitary pulmonary nodule: Secondary | ICD-10-CM | POA: Insufficient documentation

## 2015-01-11 DIAGNOSIS — R918 Other nonspecific abnormal finding of lung field: Secondary | ICD-10-CM

## 2015-01-11 HISTORY — DX: Unspecified asthma, uncomplicated: J45.909

## 2015-01-11 HISTORY — DX: Type 2 diabetes mellitus without complications: E11.9

## 2015-01-11 LAB — CREATININE, SERUM
Creatinine, Ser: 0.93 mg/dL (ref 0.61–1.24)
GFR calc non Af Amer: >60 mL/min (ref >60)

## 2015-01-11 LAB — BUN: BUN: 13 mg/dL (ref 6–20)

## 2015-01-11 MED ORDER — IOHEXOL 300 MG/ML  SOLN
75.0000 mL | Freq: Once | INTRAMUSCULAR | Status: AC | PRN
  Administered 2015-01-11: 75 mL via INTRAVENOUS

## 2015-01-13 ENCOUNTER — Ambulatory Visit: Payer: Medicare Other

## 2015-01-17 DIAGNOSIS — Z79899 Other long term (current) drug therapy: Secondary | ICD-10-CM | POA: Diagnosis not present

## 2015-01-18 DIAGNOSIS — I739 Peripheral vascular disease, unspecified: Secondary | ICD-10-CM | POA: Diagnosis not present

## 2015-01-18 DIAGNOSIS — I2510 Atherosclerotic heart disease of native coronary artery without angina pectoris: Secondary | ICD-10-CM | POA: Diagnosis not present

## 2015-01-18 DIAGNOSIS — I10 Essential (primary) hypertension: Secondary | ICD-10-CM | POA: Diagnosis not present

## 2015-01-24 DIAGNOSIS — I864 Gastric varices: Secondary | ICD-10-CM | POA: Diagnosis not present

## 2015-01-24 DIAGNOSIS — R911 Solitary pulmonary nodule: Secondary | ICD-10-CM | POA: Diagnosis not present

## 2015-01-24 DIAGNOSIS — I2510 Atherosclerotic heart disease of native coronary artery without angina pectoris: Secondary | ICD-10-CM | POA: Diagnosis not present

## 2015-01-24 DIAGNOSIS — M9903 Segmental and somatic dysfunction of lumbar region: Secondary | ICD-10-CM | POA: Diagnosis not present

## 2015-01-24 DIAGNOSIS — M5030 Other cervical disc degeneration, unspecified cervical region: Secondary | ICD-10-CM | POA: Diagnosis not present

## 2015-01-24 DIAGNOSIS — F5105 Insomnia due to other mental disorder: Secondary | ICD-10-CM | POA: Diagnosis not present

## 2015-01-24 DIAGNOSIS — M5416 Radiculopathy, lumbar region: Secondary | ICD-10-CM | POA: Diagnosis not present

## 2015-01-24 DIAGNOSIS — M9901 Segmental and somatic dysfunction of cervical region: Secondary | ICD-10-CM | POA: Diagnosis not present

## 2015-02-10 DIAGNOSIS — I2510 Atherosclerotic heart disease of native coronary artery without angina pectoris: Secondary | ICD-10-CM | POA: Diagnosis not present

## 2015-02-10 DIAGNOSIS — I739 Peripheral vascular disease, unspecified: Secondary | ICD-10-CM | POA: Diagnosis not present

## 2015-02-10 DIAGNOSIS — I10 Essential (primary) hypertension: Secondary | ICD-10-CM | POA: Diagnosis not present

## 2015-02-14 DIAGNOSIS — K861 Other chronic pancreatitis: Secondary | ICD-10-CM | POA: Diagnosis not present

## 2015-02-14 DIAGNOSIS — K2270 Barrett's esophagus without dysplasia: Secondary | ICD-10-CM | POA: Diagnosis not present

## 2015-02-14 DIAGNOSIS — I864 Gastric varices: Secondary | ICD-10-CM | POA: Diagnosis not present

## 2015-02-15 DIAGNOSIS — I82890 Acute embolism and thrombosis of other specified veins: Secondary | ICD-10-CM | POA: Insufficient documentation

## 2015-02-15 DIAGNOSIS — Z79899 Other long term (current) drug therapy: Secondary | ICD-10-CM | POA: Diagnosis not present

## 2015-02-15 DIAGNOSIS — I864 Gastric varices: Secondary | ICD-10-CM | POA: Insufficient documentation

## 2015-02-21 DIAGNOSIS — M5030 Other cervical disc degeneration, unspecified cervical region: Secondary | ICD-10-CM | POA: Diagnosis not present

## 2015-02-21 DIAGNOSIS — M9901 Segmental and somatic dysfunction of cervical region: Secondary | ICD-10-CM | POA: Diagnosis not present

## 2015-02-21 DIAGNOSIS — M9903 Segmental and somatic dysfunction of lumbar region: Secondary | ICD-10-CM | POA: Diagnosis not present

## 2015-02-21 DIAGNOSIS — M5416 Radiculopathy, lumbar region: Secondary | ICD-10-CM | POA: Diagnosis not present

## 2015-03-11 DIAGNOSIS — Z79899 Other long term (current) drug therapy: Secondary | ICD-10-CM | POA: Diagnosis not present

## 2015-03-11 DIAGNOSIS — E039 Hypothyroidism, unspecified: Secondary | ICD-10-CM | POA: Diagnosis not present

## 2015-03-11 DIAGNOSIS — D509 Iron deficiency anemia, unspecified: Secondary | ICD-10-CM | POA: Diagnosis not present

## 2015-03-11 DIAGNOSIS — E782 Mixed hyperlipidemia: Secondary | ICD-10-CM | POA: Diagnosis not present

## 2015-03-23 DIAGNOSIS — I10 Essential (primary) hypertension: Secondary | ICD-10-CM | POA: Diagnosis not present

## 2015-03-23 DIAGNOSIS — E875 Hyperkalemia: Secondary | ICD-10-CM | POA: Diagnosis not present

## 2015-03-23 DIAGNOSIS — E871 Hypo-osmolality and hyponatremia: Secondary | ICD-10-CM | POA: Diagnosis not present

## 2015-03-23 DIAGNOSIS — E785 Hyperlipidemia, unspecified: Secondary | ICD-10-CM | POA: Diagnosis not present

## 2015-03-23 DIAGNOSIS — E1129 Type 2 diabetes mellitus with other diabetic kidney complication: Secondary | ICD-10-CM | POA: Diagnosis not present

## 2015-03-24 DIAGNOSIS — M5416 Radiculopathy, lumbar region: Secondary | ICD-10-CM | POA: Diagnosis not present

## 2015-03-24 DIAGNOSIS — M9901 Segmental and somatic dysfunction of cervical region: Secondary | ICD-10-CM | POA: Diagnosis not present

## 2015-03-24 DIAGNOSIS — M5030 Other cervical disc degeneration, unspecified cervical region: Secondary | ICD-10-CM | POA: Diagnosis not present

## 2015-03-24 DIAGNOSIS — M9903 Segmental and somatic dysfunction of lumbar region: Secondary | ICD-10-CM | POA: Diagnosis not present

## 2015-03-29 DIAGNOSIS — F209 Schizophrenia, unspecified: Secondary | ICD-10-CM | POA: Diagnosis not present

## 2015-03-29 DIAGNOSIS — E1142 Type 2 diabetes mellitus with diabetic polyneuropathy: Secondary | ICD-10-CM | POA: Diagnosis not present

## 2015-03-29 DIAGNOSIS — I2510 Atherosclerotic heart disease of native coronary artery without angina pectoris: Secondary | ICD-10-CM | POA: Diagnosis not present

## 2015-03-29 DIAGNOSIS — I10 Essential (primary) hypertension: Secondary | ICD-10-CM | POA: Diagnosis not present

## 2015-03-29 DIAGNOSIS — I864 Gastric varices: Secondary | ICD-10-CM | POA: Diagnosis not present

## 2015-04-08 DIAGNOSIS — Z79899 Other long term (current) drug therapy: Secondary | ICD-10-CM | POA: Diagnosis not present

## 2015-04-25 DIAGNOSIS — M9903 Segmental and somatic dysfunction of lumbar region: Secondary | ICD-10-CM | POA: Diagnosis not present

## 2015-04-25 DIAGNOSIS — M5416 Radiculopathy, lumbar region: Secondary | ICD-10-CM | POA: Diagnosis not present

## 2015-04-25 DIAGNOSIS — M5030 Other cervical disc degeneration, unspecified cervical region: Secondary | ICD-10-CM | POA: Diagnosis not present

## 2015-04-25 DIAGNOSIS — M9901 Segmental and somatic dysfunction of cervical region: Secondary | ICD-10-CM | POA: Diagnosis not present

## 2015-05-02 DIAGNOSIS — M9901 Segmental and somatic dysfunction of cervical region: Secondary | ICD-10-CM | POA: Diagnosis not present

## 2015-05-02 DIAGNOSIS — M5416 Radiculopathy, lumbar region: Secondary | ICD-10-CM | POA: Diagnosis not present

## 2015-05-02 DIAGNOSIS — M9903 Segmental and somatic dysfunction of lumbar region: Secondary | ICD-10-CM | POA: Diagnosis not present

## 2015-05-02 DIAGNOSIS — M5030 Other cervical disc degeneration, unspecified cervical region: Secondary | ICD-10-CM | POA: Diagnosis not present

## 2015-05-10 DIAGNOSIS — F209 Schizophrenia, unspecified: Secondary | ICD-10-CM | POA: Diagnosis not present

## 2015-05-10 DIAGNOSIS — R4182 Altered mental status, unspecified: Secondary | ICD-10-CM | POA: Diagnosis not present

## 2015-05-10 DIAGNOSIS — E1142 Type 2 diabetes mellitus with diabetic polyneuropathy: Secondary | ICD-10-CM | POA: Diagnosis not present

## 2015-05-13 DIAGNOSIS — R079 Chest pain, unspecified: Secondary | ICD-10-CM | POA: Diagnosis not present

## 2015-05-16 DIAGNOSIS — M5416 Radiculopathy, lumbar region: Secondary | ICD-10-CM | POA: Diagnosis not present

## 2015-05-16 DIAGNOSIS — M9903 Segmental and somatic dysfunction of lumbar region: Secondary | ICD-10-CM | POA: Diagnosis not present

## 2015-05-16 DIAGNOSIS — M9901 Segmental and somatic dysfunction of cervical region: Secondary | ICD-10-CM | POA: Diagnosis not present

## 2015-05-16 DIAGNOSIS — M5030 Other cervical disc degeneration, unspecified cervical region: Secondary | ICD-10-CM | POA: Diagnosis not present

## 2015-05-20 DIAGNOSIS — I2510 Atherosclerotic heart disease of native coronary artery without angina pectoris: Secondary | ICD-10-CM | POA: Diagnosis not present

## 2015-05-20 DIAGNOSIS — I10 Essential (primary) hypertension: Secondary | ICD-10-CM | POA: Diagnosis not present

## 2015-05-24 DIAGNOSIS — R4182 Altered mental status, unspecified: Secondary | ICD-10-CM | POA: Diagnosis not present

## 2015-05-24 DIAGNOSIS — F200 Paranoid schizophrenia: Secondary | ICD-10-CM | POA: Diagnosis not present

## 2015-05-24 DIAGNOSIS — Z79899 Other long term (current) drug therapy: Secondary | ICD-10-CM | POA: Diagnosis not present

## 2015-05-24 DIAGNOSIS — Z7984 Long term (current) use of oral hypoglycemic drugs: Secondary | ICD-10-CM | POA: Diagnosis not present

## 2015-05-24 DIAGNOSIS — I10 Essential (primary) hypertension: Secondary | ICD-10-CM | POA: Diagnosis not present

## 2015-05-24 DIAGNOSIS — Z794 Long term (current) use of insulin: Secondary | ICD-10-CM | POA: Diagnosis not present

## 2015-05-24 DIAGNOSIS — F09 Unspecified mental disorder due to known physiological condition: Secondary | ICD-10-CM | POA: Diagnosis not present

## 2015-05-24 DIAGNOSIS — E119 Type 2 diabetes mellitus without complications: Secondary | ICD-10-CM | POA: Diagnosis not present

## 2015-05-24 DIAGNOSIS — F99 Mental disorder, not otherwise specified: Secondary | ICD-10-CM | POA: Diagnosis not present

## 2015-05-30 DIAGNOSIS — M5030 Other cervical disc degeneration, unspecified cervical region: Secondary | ICD-10-CM | POA: Diagnosis not present

## 2015-05-30 DIAGNOSIS — M5416 Radiculopathy, lumbar region: Secondary | ICD-10-CM | POA: Diagnosis not present

## 2015-05-30 DIAGNOSIS — M9903 Segmental and somatic dysfunction of lumbar region: Secondary | ICD-10-CM | POA: Diagnosis not present

## 2015-05-30 DIAGNOSIS — M9901 Segmental and somatic dysfunction of cervical region: Secondary | ICD-10-CM | POA: Diagnosis not present

## 2015-06-03 DIAGNOSIS — Z79899 Other long term (current) drug therapy: Secondary | ICD-10-CM | POA: Diagnosis not present

## 2015-06-13 DIAGNOSIS — M9901 Segmental and somatic dysfunction of cervical region: Secondary | ICD-10-CM | POA: Diagnosis not present

## 2015-06-13 DIAGNOSIS — M5416 Radiculopathy, lumbar region: Secondary | ICD-10-CM | POA: Diagnosis not present

## 2015-06-13 DIAGNOSIS — M5030 Other cervical disc degeneration, unspecified cervical region: Secondary | ICD-10-CM | POA: Diagnosis not present

## 2015-06-13 DIAGNOSIS — M9903 Segmental and somatic dysfunction of lumbar region: Secondary | ICD-10-CM | POA: Diagnosis not present

## 2015-06-20 DIAGNOSIS — I864 Gastric varices: Secondary | ICD-10-CM | POA: Diagnosis not present

## 2015-06-20 DIAGNOSIS — E1142 Type 2 diabetes mellitus with diabetic polyneuropathy: Secondary | ICD-10-CM | POA: Diagnosis not present

## 2015-06-20 DIAGNOSIS — E039 Hypothyroidism, unspecified: Secondary | ICD-10-CM | POA: Diagnosis not present

## 2015-06-27 DIAGNOSIS — F209 Schizophrenia, unspecified: Secondary | ICD-10-CM | POA: Diagnosis not present

## 2015-06-27 DIAGNOSIS — F200 Paranoid schizophrenia: Secondary | ICD-10-CM | POA: Diagnosis not present

## 2015-06-27 DIAGNOSIS — Z79899 Other long term (current) drug therapy: Secondary | ICD-10-CM | POA: Diagnosis not present

## 2015-06-29 DIAGNOSIS — E11628 Type 2 diabetes mellitus with other skin complications: Secondary | ICD-10-CM | POA: Diagnosis not present

## 2015-06-29 DIAGNOSIS — E782 Mixed hyperlipidemia: Secondary | ICD-10-CM | POA: Diagnosis not present

## 2015-07-04 DIAGNOSIS — M5416 Radiculopathy, lumbar region: Secondary | ICD-10-CM | POA: Diagnosis not present

## 2015-07-04 DIAGNOSIS — M9903 Segmental and somatic dysfunction of lumbar region: Secondary | ICD-10-CM | POA: Diagnosis not present

## 2015-07-04 DIAGNOSIS — M5030 Other cervical disc degeneration, unspecified cervical region: Secondary | ICD-10-CM | POA: Diagnosis not present

## 2015-07-04 DIAGNOSIS — M9901 Segmental and somatic dysfunction of cervical region: Secondary | ICD-10-CM | POA: Diagnosis not present

## 2015-07-05 DIAGNOSIS — Z79899 Other long term (current) drug therapy: Secondary | ICD-10-CM | POA: Diagnosis not present

## 2015-08-02 DIAGNOSIS — Z79899 Other long term (current) drug therapy: Secondary | ICD-10-CM | POA: Diagnosis not present

## 2015-08-04 DIAGNOSIS — M5030 Other cervical disc degeneration, unspecified cervical region: Secondary | ICD-10-CM | POA: Diagnosis not present

## 2015-08-04 DIAGNOSIS — M9903 Segmental and somatic dysfunction of lumbar region: Secondary | ICD-10-CM | POA: Diagnosis not present

## 2015-08-04 DIAGNOSIS — M9901 Segmental and somatic dysfunction of cervical region: Secondary | ICD-10-CM | POA: Diagnosis not present

## 2015-08-04 DIAGNOSIS — M5416 Radiculopathy, lumbar region: Secondary | ICD-10-CM | POA: Diagnosis not present

## 2015-08-08 ENCOUNTER — Other Ambulatory Visit: Payer: Self-pay | Admitting: Internal Medicine

## 2015-08-08 DIAGNOSIS — R911 Solitary pulmonary nodule: Secondary | ICD-10-CM

## 2015-08-10 ENCOUNTER — Other Ambulatory Visit
Admission: RE | Admit: 2015-08-10 | Discharge: 2015-08-10 | Disposition: A | Discharge status: Home / Self Care | Payer: Medicare Other | Source: Ambulatory Visit | Attending: Internal Medicine | Admitting: Internal Medicine

## 2015-08-10 ENCOUNTER — Ambulatory Visit
Admission: RE | Admit: 2015-08-10 | Discharge: 2015-08-10 | Disposition: A | Discharge status: Home / Self Care | Payer: Medicare Other | Source: Ambulatory Visit | Attending: Internal Medicine | Admitting: Internal Medicine

## 2015-08-10 DIAGNOSIS — R918 Other nonspecific abnormal finding of lung field: Secondary | ICD-10-CM | POA: Insufficient documentation

## 2015-08-10 DIAGNOSIS — R933 Abnormal findings on diagnostic imaging of other parts of digestive tract: Secondary | ICD-10-CM | POA: Insufficient documentation

## 2015-08-10 DIAGNOSIS — K861 Other chronic pancreatitis: Secondary | ICD-10-CM | POA: Insufficient documentation

## 2015-08-10 DIAGNOSIS — R911 Solitary pulmonary nodule: Secondary | ICD-10-CM

## 2015-08-10 LAB — CREATININE, SERUM
CREATININE: 0.96 mg/dL (ref 0.61–1.24)
GFR calc Af Amer: >60 mL/min (ref >60)
GFR calc non Af Amer: >60 mL/min (ref >60)

## 2015-08-10 MED ORDER — IOHEXOL 300 MG/ML  SOLN: 75.0000 mL | Freq: Once | INTRAMUSCULAR | Status: DC | PRN

## 2015-08-10 MED ORDER — IOPAMIDOL (ISOVUE-300) INJECTION 61%: 75.0000 mL | Freq: Once | INTRAVENOUS | Status: DC | PRN

## 2015-08-22 ENCOUNTER — Other Ambulatory Visit: Payer: Self-pay | Admitting: Family Medicine

## 2015-08-24 DIAGNOSIS — J449 Chronic obstructive pulmonary disease, unspecified: Secondary | ICD-10-CM | POA: Diagnosis not present

## 2015-08-24 DIAGNOSIS — I864 Gastric varices: Secondary | ICD-10-CM | POA: Diagnosis not present

## 2015-08-24 DIAGNOSIS — R0683 Snoring: Secondary | ICD-10-CM | POA: Diagnosis not present

## 2015-08-24 DIAGNOSIS — I2510 Atherosclerotic heart disease of native coronary artery without angina pectoris: Secondary | ICD-10-CM | POA: Diagnosis not present

## 2015-08-24 DIAGNOSIS — R911 Solitary pulmonary nodule: Secondary | ICD-10-CM | POA: Diagnosis not present

## 2015-08-29 DIAGNOSIS — Z79899 Other long term (current) drug therapy: Secondary | ICD-10-CM | POA: Diagnosis not present

## 2015-09-02 DIAGNOSIS — M9903 Segmental and somatic dysfunction of lumbar region: Secondary | ICD-10-CM | POA: Diagnosis not present

## 2015-09-02 DIAGNOSIS — M5416 Radiculopathy, lumbar region: Secondary | ICD-10-CM | POA: Diagnosis not present

## 2015-09-02 DIAGNOSIS — M9901 Segmental and somatic dysfunction of cervical region: Secondary | ICD-10-CM | POA: Diagnosis not present

## 2015-09-02 DIAGNOSIS — M5030 Other cervical disc degeneration, unspecified cervical region: Secondary | ICD-10-CM | POA: Diagnosis not present

## 2015-09-21 DIAGNOSIS — E871 Hypo-osmolality and hyponatremia: Secondary | ICD-10-CM | POA: Diagnosis not present

## 2015-09-21 DIAGNOSIS — E1129 Type 2 diabetes mellitus with other diabetic kidney complication: Secondary | ICD-10-CM | POA: Diagnosis not present

## 2015-09-21 DIAGNOSIS — I10 Essential (primary) hypertension: Secondary | ICD-10-CM | POA: Diagnosis not present

## 2015-09-29 DIAGNOSIS — Z79899 Other long term (current) drug therapy: Secondary | ICD-10-CM | POA: Diagnosis not present

## 2015-10-03 DIAGNOSIS — M5416 Radiculopathy, lumbar region: Secondary | ICD-10-CM | POA: Diagnosis not present

## 2015-10-03 DIAGNOSIS — M9901 Segmental and somatic dysfunction of cervical region: Secondary | ICD-10-CM | POA: Diagnosis not present

## 2015-10-03 DIAGNOSIS — M9903 Segmental and somatic dysfunction of lumbar region: Secondary | ICD-10-CM | POA: Diagnosis not present

## 2015-10-03 DIAGNOSIS — M5030 Other cervical disc degeneration, unspecified cervical region: Secondary | ICD-10-CM | POA: Diagnosis not present

## 2015-10-27 DIAGNOSIS — Z79899 Other long term (current) drug therapy: Secondary | ICD-10-CM | POA: Diagnosis not present

## 2015-11-01 ENCOUNTER — Other Ambulatory Visit: Payer: Self-pay | Admitting: Physician Assistant

## 2015-11-01 DIAGNOSIS — R911 Solitary pulmonary nodule: Secondary | ICD-10-CM

## 2015-11-01 DIAGNOSIS — M5030 Other cervical disc degeneration, unspecified cervical region: Secondary | ICD-10-CM | POA: Diagnosis not present

## 2015-11-01 DIAGNOSIS — M9901 Segmental and somatic dysfunction of cervical region: Secondary | ICD-10-CM | POA: Diagnosis not present

## 2015-11-01 DIAGNOSIS — M5416 Radiculopathy, lumbar region: Secondary | ICD-10-CM | POA: Diagnosis not present

## 2015-11-01 DIAGNOSIS — M9903 Segmental and somatic dysfunction of lumbar region: Secondary | ICD-10-CM | POA: Diagnosis not present

## 2015-11-17 DIAGNOSIS — I10 Essential (primary) hypertension: Secondary | ICD-10-CM | POA: Diagnosis not present

## 2015-11-17 DIAGNOSIS — I2510 Atherosclerotic heart disease of native coronary artery without angina pectoris: Secondary | ICD-10-CM | POA: Diagnosis not present

## 2015-11-22 DIAGNOSIS — Z79899 Other long term (current) drug therapy: Secondary | ICD-10-CM | POA: Diagnosis not present

## 2015-11-29 ENCOUNTER — Ambulatory Visit
Admission: RE | Admit: 2015-11-29 | Discharge: 2015-11-29 | Disposition: A | Discharge status: Home / Self Care | Payer: Medicare Other | Source: Ambulatory Visit | Attending: Physician Assistant | Admitting: Physician Assistant

## 2015-11-29 DIAGNOSIS — R911 Solitary pulmonary nodule: Secondary | ICD-10-CM | POA: Diagnosis not present

## 2015-11-29 DIAGNOSIS — D735 Infarction of spleen: Secondary | ICD-10-CM | POA: Diagnosis not present

## 2015-11-29 DIAGNOSIS — I2510 Atherosclerotic heart disease of native coronary artery without angina pectoris: Secondary | ICD-10-CM | POA: Diagnosis not present

## 2015-11-29 LAB — POCT I-STAT CREATININE: Creatinine, Ser: 0.9 mg/dL (ref 0.61–1.24)

## 2015-11-29 MED ORDER — IOPAMIDOL (ISOVUE-300) INJECTION 61%
75.0000 mL | Freq: Once | INTRAVENOUS | Status: AC | PRN
  Administered 2015-11-29: 75 mL via INTRAVENOUS

## 2015-12-01 DIAGNOSIS — R0683 Snoring: Secondary | ICD-10-CM | POA: Diagnosis not present

## 2015-12-01 DIAGNOSIS — I864 Gastric varices: Secondary | ICD-10-CM | POA: Diagnosis not present

## 2015-12-01 DIAGNOSIS — R911 Solitary pulmonary nodule: Secondary | ICD-10-CM | POA: Diagnosis not present

## 2015-12-01 DIAGNOSIS — J449 Chronic obstructive pulmonary disease, unspecified: Secondary | ICD-10-CM | POA: Diagnosis not present

## 2015-12-06 DIAGNOSIS — M5416 Radiculopathy, lumbar region: Secondary | ICD-10-CM | POA: Diagnosis not present

## 2015-12-06 DIAGNOSIS — M9903 Segmental and somatic dysfunction of lumbar region: Secondary | ICD-10-CM | POA: Diagnosis not present

## 2015-12-06 DIAGNOSIS — M5030 Other cervical disc degeneration, unspecified cervical region: Secondary | ICD-10-CM | POA: Diagnosis not present

## 2015-12-06 DIAGNOSIS — M9901 Segmental and somatic dysfunction of cervical region: Secondary | ICD-10-CM | POA: Diagnosis not present

## 2015-12-21 DIAGNOSIS — Z79899 Other long term (current) drug therapy: Secondary | ICD-10-CM | POA: Diagnosis not present

## 2015-12-28 DIAGNOSIS — R0602 Shortness of breath: Secondary | ICD-10-CM | POA: Diagnosis not present

## 2016-01-02 DIAGNOSIS — I10 Essential (primary) hypertension: Secondary | ICD-10-CM | POA: Diagnosis not present

## 2016-01-02 DIAGNOSIS — Z0001 Encounter for general adult medical examination with abnormal findings: Secondary | ICD-10-CM | POA: Diagnosis not present

## 2016-01-02 DIAGNOSIS — E1142 Type 2 diabetes mellitus with diabetic polyneuropathy: Secondary | ICD-10-CM | POA: Diagnosis not present

## 2016-01-02 DIAGNOSIS — E1165 Type 2 diabetes mellitus with hyperglycemia: Secondary | ICD-10-CM | POA: Diagnosis not present

## 2016-01-02 DIAGNOSIS — I864 Gastric varices: Secondary | ICD-10-CM | POA: Diagnosis not present

## 2016-01-02 DIAGNOSIS — Z23 Encounter for immunization: Secondary | ICD-10-CM | POA: Diagnosis not present

## 2016-01-02 DIAGNOSIS — E039 Hypothyroidism, unspecified: Secondary | ICD-10-CM | POA: Diagnosis not present

## 2016-01-04 DIAGNOSIS — Z125 Encounter for screening for malignant neoplasm of prostate: Secondary | ICD-10-CM | POA: Diagnosis not present

## 2016-01-04 DIAGNOSIS — Z0001 Encounter for general adult medical examination with abnormal findings: Secondary | ICD-10-CM | POA: Diagnosis not present

## 2016-01-04 DIAGNOSIS — E1165 Type 2 diabetes mellitus with hyperglycemia: Secondary | ICD-10-CM | POA: Diagnosis not present

## 2016-01-10 DIAGNOSIS — M9903 Segmental and somatic dysfunction of lumbar region: Secondary | ICD-10-CM | POA: Diagnosis not present

## 2016-01-10 DIAGNOSIS — K635 Polyp of colon: Secondary | ICD-10-CM | POA: Diagnosis not present

## 2016-01-10 DIAGNOSIS — K2270 Barrett's esophagus without dysplasia: Secondary | ICD-10-CM | POA: Diagnosis not present

## 2016-01-10 DIAGNOSIS — M5030 Other cervical disc degeneration, unspecified cervical region: Secondary | ICD-10-CM | POA: Diagnosis not present

## 2016-01-10 DIAGNOSIS — K861 Other chronic pancreatitis: Secondary | ICD-10-CM | POA: Diagnosis not present

## 2016-01-10 DIAGNOSIS — M5416 Radiculopathy, lumbar region: Secondary | ICD-10-CM | POA: Diagnosis not present

## 2016-01-10 DIAGNOSIS — M9901 Segmental and somatic dysfunction of cervical region: Secondary | ICD-10-CM | POA: Diagnosis not present

## 2016-01-17 DIAGNOSIS — Z79899 Other long term (current) drug therapy: Secondary | ICD-10-CM | POA: Diagnosis not present

## 2016-01-30 DIAGNOSIS — I10 Essential (primary) hypertension: Secondary | ICD-10-CM | POA: Diagnosis not present

## 2016-01-30 DIAGNOSIS — K2270 Barrett's esophagus without dysplasia: Secondary | ICD-10-CM | POA: Diagnosis not present

## 2016-01-30 DIAGNOSIS — E1142 Type 2 diabetes mellitus with diabetic polyneuropathy: Secondary | ICD-10-CM | POA: Diagnosis not present

## 2016-02-09 DIAGNOSIS — E119 Type 2 diabetes mellitus without complications: Secondary | ICD-10-CM | POA: Diagnosis not present

## 2016-02-09 DIAGNOSIS — I10 Essential (primary) hypertension: Secondary | ICD-10-CM | POA: Diagnosis not present

## 2016-02-09 DIAGNOSIS — D509 Iron deficiency anemia, unspecified: Secondary | ICD-10-CM | POA: Diagnosis not present

## 2016-02-09 DIAGNOSIS — E039 Hypothyroidism, unspecified: Secondary | ICD-10-CM | POA: Diagnosis not present

## 2016-02-09 DIAGNOSIS — E785 Hyperlipidemia, unspecified: Secondary | ICD-10-CM | POA: Diagnosis not present

## 2016-02-14 DIAGNOSIS — M9903 Segmental and somatic dysfunction of lumbar region: Secondary | ICD-10-CM | POA: Diagnosis not present

## 2016-02-14 DIAGNOSIS — Z79899 Other long term (current) drug therapy: Secondary | ICD-10-CM | POA: Diagnosis not present

## 2016-02-14 DIAGNOSIS — M5416 Radiculopathy, lumbar region: Secondary | ICD-10-CM | POA: Diagnosis not present

## 2016-02-14 DIAGNOSIS — M5030 Other cervical disc degeneration, unspecified cervical region: Secondary | ICD-10-CM | POA: Diagnosis not present

## 2016-02-14 DIAGNOSIS — M9901 Segmental and somatic dysfunction of cervical region: Secondary | ICD-10-CM | POA: Diagnosis not present

## 2016-02-14 DIAGNOSIS — E789 Disorder of lipoprotein metabolism, unspecified: Secondary | ICD-10-CM | POA: Diagnosis not present

## 2016-05-04 ENCOUNTER — Encounter: Payer: Self-pay | Admitting: *Deleted

## 2016-05-07 ENCOUNTER — Encounter
Admission: RE | Disposition: A | Discharge status: Home / Self Care | Payer: Self-pay | Source: Ambulatory Visit | Attending: Gastroenterology

## 2016-05-07 ENCOUNTER — Ambulatory Visit: Payer: Medicare Other | Admitting: Anesthesiology

## 2016-05-07 ENCOUNTER — Ambulatory Visit
Admission: RE | Admit: 2016-05-07 | Discharge: 2016-05-07 | Disposition: A | Discharge status: Home / Self Care | Payer: Medicare Other | Source: Ambulatory Visit | Attending: Gastroenterology | Admitting: Gastroenterology

## 2016-05-07 DIAGNOSIS — E039 Hypothyroidism, unspecified: Secondary | ICD-10-CM | POA: Diagnosis not present

## 2016-05-07 DIAGNOSIS — G4730 Sleep apnea, unspecified: Secondary | ICD-10-CM | POA: Diagnosis not present

## 2016-05-07 DIAGNOSIS — K2970 Gastritis, unspecified, without bleeding: Secondary | ICD-10-CM | POA: Insufficient documentation

## 2016-05-07 DIAGNOSIS — K449 Diaphragmatic hernia without obstruction or gangrene: Secondary | ICD-10-CM | POA: Insufficient documentation

## 2016-05-07 DIAGNOSIS — K2270 Barrett's esophagus without dysplasia: Secondary | ICD-10-CM | POA: Diagnosis present

## 2016-05-07 DIAGNOSIS — I8500 Esophageal varices without bleeding: Secondary | ICD-10-CM | POA: Diagnosis not present

## 2016-05-07 DIAGNOSIS — K219 Gastro-esophageal reflux disease without esophagitis: Secondary | ICD-10-CM | POA: Diagnosis not present

## 2016-05-07 DIAGNOSIS — Z79899 Other long term (current) drug therapy: Secondary | ICD-10-CM | POA: Diagnosis not present

## 2016-05-07 DIAGNOSIS — F200 Paranoid schizophrenia: Secondary | ICD-10-CM | POA: Diagnosis not present

## 2016-05-07 DIAGNOSIS — Z86010 Personal history of colonic polyps: Secondary | ICD-10-CM | POA: Diagnosis not present

## 2016-05-07 DIAGNOSIS — I10 Essential (primary) hypertension: Secondary | ICD-10-CM | POA: Insufficient documentation

## 2016-05-07 DIAGNOSIS — Z538 Procedure and treatment not carried out for other reasons: Secondary | ICD-10-CM | POA: Diagnosis not present

## 2016-05-07 DIAGNOSIS — I864 Gastric varices: Secondary | ICD-10-CM | POA: Insufficient documentation

## 2016-05-07 DIAGNOSIS — E785 Hyperlipidemia, unspecified: Secondary | ICD-10-CM | POA: Diagnosis not present

## 2016-05-07 DIAGNOSIS — Z1211 Encounter for screening for malignant neoplasm of colon: Secondary | ICD-10-CM | POA: Diagnosis not present

## 2016-05-07 DIAGNOSIS — Z794 Long term (current) use of insulin: Secondary | ICD-10-CM | POA: Insufficient documentation

## 2016-05-07 DIAGNOSIS — Z7982 Long term (current) use of aspirin: Secondary | ICD-10-CM | POA: Insufficient documentation

## 2016-05-07 DIAGNOSIS — E119 Type 2 diabetes mellitus without complications: Secondary | ICD-10-CM | POA: Insufficient documentation

## 2016-05-07 HISTORY — DX: Vitamin D deficiency, unspecified: E55.9

## 2016-05-07 HISTORY — DX: Paranoid schizophrenia: F20.0

## 2016-05-07 HISTORY — DX: Schizophrenia, unspecified: F20.9

## 2016-05-07 HISTORY — DX: Hyperlipidemia, unspecified: E78.5

## 2016-05-07 HISTORY — DX: Rosacea, unspecified: L71.9

## 2016-05-07 HISTORY — DX: Insomnia, unspecified: G47.00

## 2016-05-07 HISTORY — DX: Anemia, unspecified: D64.9

## 2016-05-07 HISTORY — DX: Personal history of other diseases of the digestive system: Z87.19

## 2016-05-07 HISTORY — DX: Barrett's esophagus without dysplasia: K22.70

## 2016-05-07 HISTORY — DX: Psychological and behavioral factors associated with disorders or diseases classified elsewhere: F54

## 2016-05-07 HISTORY — PX: ESOPHAGOGASTRODUODENOSCOPY (EGD) WITH PROPOFOL: SHX5813

## 2016-05-07 HISTORY — DX: Gout, unspecified: M10.9

## 2016-05-07 HISTORY — DX: Dysphagia, unspecified: R13.10

## 2016-05-07 HISTORY — DX: Esophageal varices without bleeding: I85.00

## 2016-05-07 HISTORY — DX: Polydipsia: R63.1

## 2016-05-07 HISTORY — PX: COLONOSCOPY WITH PROPOFOL: SHX5780

## 2016-05-07 LAB — CBC WITH DIFFERENTIAL/PLATELET
BASOS ABS: 0 10*3/uL (ref 0–0.1)
BASOS PCT: 1 %
EOS ABS: 0.1 10*3/uL (ref 0–0.7)
Eosinophils Relative: 2 %
HCT: 41.1 % (ref 40.0–52.0)
Hemoglobin: 14.4 g/dL (ref 13.0–18.0)
Lymphocytes Relative: 25 %
Lymphs Abs: 1.5 10*3/uL (ref 1.0–3.6)
MCH: 31.2 pg (ref 26.0–34.0)
MCHC: 35.1 g/dL (ref 32.0–36.0)
MCV: 88.7 fL (ref 80.0–100.0)
MONO ABS: 0.4 10*3/uL (ref 0.2–1.0)
MONOS PCT: 7 %
NEUTROS ABS: 3.8 10*3/uL (ref 1.4–6.5)
Neutrophils Relative %: 65 %
PLATELETS: 169 10*3/uL (ref 150–440)
RBC: 4.63 MIL/uL (ref 4.40–5.90)
RDW: 13.2 % (ref 11.5–14.5)
WBC: 5.8 10*3/uL (ref 3.8–10.6)

## 2016-05-07 LAB — PROTIME-INR
INR: 1.04
PROTHROMBIN TIME: 13.6 s (ref 11.4–15.2)

## 2016-05-07 LAB — GLUCOSE, CAPILLARY: GLUCOSE-CAPILLARY: 116 mg/dL — AB (ref 65–99)

## 2016-05-07 SURGERY — COLONOSCOPY WITH PROPOFOL
Anesthesia: General

## 2016-05-07 MED ORDER — PROPOFOL 10 MG/ML IV BOLUS
INTRAVENOUS | Status: DC | PRN
  Administered 2016-05-07: 30 mg via INTRAVENOUS
  Administered 2016-05-07: 80 mg via INTRAVENOUS
  Administered 2016-05-07: 20 mg via INTRAVENOUS

## 2016-05-07 MED ORDER — PROPOFOL 10 MG/ML IV BOLUS
INTRAVENOUS | Status: AC
  Filled 2016-05-07: qty 20

## 2016-05-07 MED ORDER — GLYCOPYRROLATE 0.2 MG/ML IJ SOLN
INTRAMUSCULAR | Status: DC | PRN
  Administered 2016-05-07: 0.2 mg via INTRAVENOUS

## 2016-05-07 MED ORDER — LIDOCAINE HCL (PF) 2 % IJ SOLN
INTRAMUSCULAR | Status: AC
  Filled 2016-05-07: qty 2

## 2016-05-07 MED ORDER — LIDOCAINE HCL (CARDIAC) 20 MG/ML IV SOLN
INTRAVENOUS | Status: DC | PRN
  Administered 2016-05-07: 100 mg via INTRAVENOUS

## 2016-05-07 MED ORDER — EPHEDRINE 5 MG/ML INJ
INTRAVENOUS | Status: AC
  Filled 2016-05-07: qty 10

## 2016-05-07 MED ORDER — PROPOFOL 500 MG/50ML IV EMUL
INTRAVENOUS | Status: DC | PRN
  Administered 2016-05-07: 125 ug/kg/min via INTRAVENOUS

## 2016-05-07 MED ORDER — PROPOFOL 500 MG/50ML IV EMUL
INTRAVENOUS | Status: AC
  Filled 2016-05-07: qty 50

## 2016-05-07 MED ORDER — SODIUM CHLORIDE 0.9 % IV SOLN
INTRAVENOUS | Status: DC
  Administered 2016-05-07: 08:00:00 via INTRAVENOUS

## 2016-05-07 MED ORDER — EPHEDRINE SULFATE 50 MG/ML IJ SOLN
INTRAMUSCULAR | Status: DC | PRN
  Administered 2016-05-07 (×3): 10 mg via INTRAVENOUS
  Administered 2016-05-07: 15 mg via INTRAVENOUS

## 2016-05-07 MED ORDER — LACTATED RINGERS IV SOLN
INTRAVENOUS | Status: DC | PRN
  Administered 2016-05-07: 09:00:00 via INTRAVENOUS

## 2016-05-07 MED ORDER — GLYCOPYRROLATE 0.2 MG/ML IJ SOLN
INTRAMUSCULAR | Status: AC
  Filled 2016-05-07: qty 1

## 2016-05-07 MED ORDER — SODIUM CHLORIDE 0.9 % IV SOLN: INTRAVENOUS | Status: DC

## 2016-05-07 NOTE — Op Note (Signed)
[facility] Gastroenterology Patient Name: Dalton Thomas Procedure Date: 05/07/2016 8:56 AM MRN: [id number] Account #: [id number] Date of Birth: 1956/02/24 Admit Type: Outpatient Age: 61 Room: [facility] ENDO ROOM 3 Gender: Male Note Status: Finalized Procedure:            Colonoscopy Indications:          Personal history of colonic polyps Providers:            Christena Deem, MD Referring MD:         Mary Sella. Mayford Knife PA, PA (Referring MD) Medicines:            Monitored Anesthesia Care Complications:        No immediate complications. Procedure:            Pre-Anesthesia Assessment:                       - ASA Grade Assessment: III - A patient with severe                        systemic disease.                       After obtaining informed consent, the colonoscope was                        passed under direct vision. Throughout the procedure,                        the patient's blood pressure, pulse, and oxygen                        saturations were monitored continuously. The                        Colonoscope was introduced through the anus with the                        intention of advancing to the cecum. The scope was                        advanced to the sigmoid colon before the procedure was                        aborted. Medications were given. The colonoscopy was                        unusually difficult due to poor bowel prep. The quality                        of the bowel preparation was poor. Findings:      A large amount of semi-liquid stool was found in the rectum and in the       sigmoid colon, precluding visualization. Impression:           - Preparation of the colon was poor.                       - Stool in the rectum and in the sigmoid colon.                       -  No specimens collected. Recommendation:       - reschedule and reprep                       - Discharge patient to home. Procedure Code(s):    --- Professional ---                [phone], 53, Colonoscopy, flexible; diagnostic, including                        collection of specimen(s) by brushing or washing, when                        performed (separate procedure) Diagnosis Code(s):    --- Professional ---                       Z86.010, Personal history of colonic polyps CPT copyright 2016 American Medical Association. All rights reserved. The codes documented in this report are preliminary and upon coder review may  be revised to meet current compliance requirements. Christena DeemMartin U Arnetta Odeh, MD 05/07/2016 9:56:23 AM This report has been signed electronically. Number of Addenda: 0 Note Initiated On: 05/07/2016 8:56 AM Total Procedure Duration: 0 hours 1 minute 36 seconds       [facility]

## 2016-05-07 NOTE — Op Note (Signed)
[facility] Gastroenterology Patient Name: Dalton Thomas Procedure Date: 05/07/2016 8:56 AM MRN: [id number] Account #: [id number] Date of Birth: 04/04/1956 Admit Type: Outpatient Age: 61 Room: [facility] ENDO ROOM 3 Gender: Male Note Status: Finalized Procedure:            Upper GI endoscopy Indications:          Follow-up of Barrett's esophagus Providers:            Christena Deem, MD Referring MD:         Mary Sella. Mayford Knife PA, PA (Referring MD) Medicines:            Monitored Anesthesia Care Complications:        No immediate complications. Procedure:            Pre-Anesthesia Assessment:                       - ASA Grade Assessment: III - A patient with severe                        systemic disease.                       After obtaining informed consent, the endoscope was                        passed under direct vision. Throughout the procedure,                        the patient's blood pressure, pulse, and oxygen                        saturations were monitored continuously. The Endoscope                        was introduced through the mouth, and advanced to the                        third part of duodenum. The upper GI endoscopy was                        accomplished without difficulty. The patient tolerated                        the procedure well. Findings:      There were esophageal mucosal changes consistent with long-segment       Barrett's esophagus present in the lower third of the esophagus. The       maximum longitudinal extent of these mucosal changes was 6 cm in length.       Mucosa was biopsied with a cold forceps for histology in a targeted       manner and in 4 quadrants at intervals of 2 cm at 30, 32, 34 and 36 cm       from the incisors. Biopsies were not taken from the bottom level at the       hiatal hernia, with varices present as noted. A total of 4 specimen       bottles were sent to pathology. All sites had good hemostasis.      A  small hiatal hernia was present. Small varices were seen in the distal  2 cm of the esophagus, however these were less apparent than previously       noted.      Gastric varices seen on the last scope in the cardia were not apparent       today.      The examined duodenum was normal.      Patchy minimal inflammation characterized by erythema was found in the       gastric body.      The cardia and gastric fundus were normal on retroflexion otherwise. Impression:           - Esophageal mucosal changes consistent with                        long-segment Barrett's esophagus. Biopsied.                       - Small hiatal hernia.                       - Normal examined duodenum.                       - Gastritis. Recommendation:       - Perform a colonoscopy today.                       - Continue present medications.                       - Await pathology results.                       - Return to GI clinic in 1 month. Procedure Code(s):    --- Professional ---                       [phone], Esophagogastroduodenoscopy, flexible, transoral;                        with biopsy, single or multiple Diagnosis Code(s):    --- Professional ---                       K22.70, Barrett's esophagus without dysplasia                       K44.9, Diaphragmatic hernia without obstruction or                        gangrene                       K29.70, Gastritis, unspecified, without bleeding CPT copyright 2016 American Medical Association. All rights reserved. The codes documented in this report are preliminary and upon coder review may  be revised to meet current compliance requirements. Christena DeemMartin U Skulskie, MD 05/07/2016 9:47:44 AM This report has been signed electronically. Number of Addenda: 0 Note Initiated On: 05/07/2016 8:56 AM      [facility]

## 2016-05-07 NOTE — H&P (Signed)
Outpatient short stay form Pre-procedure 05/07/2016 8:54 AM Christena Deem MD  Primary Physician: Dr Beverely Risen  Reason for visit:  EGD and colonoscopy  History of present illness:  Patient is a 61 year old male presenting today as above. He has personal history of adenomatous colon polyps as well as Barrett's esophagus. He tolerated his prep well. He takes 81 mg aspirin daily. External other blood thinning agents or aspirin products.    Current Facility-Administered Medications:  .  0.9 %  sodium chloride infusion, , Intravenous, Continuous, Christena Deem, MD, Last Rate: 20 mL/hr at 05/07/16 0817 .  0.9 %  sodium chloride infusion, , Intravenous, Continuous, Christena Deem, MD  Prescriptions Prior to Admission  Medication Sig Dispense Refill Last Dose  . aspirin EC 81 MG tablet Take 81 mg by mouth daily.   05/02/2016  . cetirizine (ZYRTEC) 10 MG tablet Take 10 mg by mouth daily.   05/06/2016 at Unknown time  . cloZAPine (CLOZARIL) 100 MG tablet Take 100 mg by mouth at bedtime.     Marland Kitchen esomeprazole (NEXIUM) 40 MG capsule Take 40 mg by mouth 2 (two) times daily before a meal.   05/07/2016 at Unknown time  . ferrous sulfate 325 (65 FE) MG tablet Take 325 mg by mouth daily with breakfast.   05/06/2016 at Unknown time  . fluPHENAZine (PROLIXIN) 10 MG tablet Take 10 mg by mouth daily.     . fluticasone furoate-vilanterol (BREO ELLIPTA) 100-25 MCG/INH AEPB Inhale 1 puff into the lungs daily.     . Iloperidone (FANAPT) 8 MG TABS Take by mouth 2 (two) times daily.     . Insulin Glargine (TOUJEO SOLOSTAR) 300 UNIT/ML SOPN Inject 28 Units into the skin daily.     . insulin lispro protamine-lispro (HUMALOG 50/50 MIX) (50-50) 100 UNIT/ML SUSP injection Inject 2 Units into the skin 3 (three) times daily.     Marland Kitchen levothyroxine (SYNTHROID, LEVOTHROID) 75 MCG tablet Take 75 mcg by mouth daily before breakfast.   05/07/2016 at Unknown time  . losartan (COZAAR) 50 MG tablet Take 50 mg by mouth daily.    05/07/2016 at Unknown time  . Melatonin 5 MG CAPS Take by mouth.     . metFORMIN (GLUCOPHAGE) 1000 MG tablet Take 2,000 mg by mouth daily with breakfast.    05/05/2016  . metoprolol (LOPRESSOR) 50 MG tablet Take 50 mg by mouth 2 (two) times daily.    05/07/2016 at Unknown time  . metroNIDAZOLE (METROGEL) 0.75 % gel Apply 1 application topically 2 (two) times daily.     . Miconazole Nitrate (FUNGOID TINCTURE) 2 % SOLN Apply topically at bedtime.     . Multiple Vitamin (THEREMS PO) Take by mouth daily at 2 PM.     . Pancrelipase, Lip-Prot-Amyl, (CREON PO) Take 2 capsules by mouth QID.     Marland Kitchen polyethylene glycol (MIRALAX / GLYCOLAX) packet Take 17 g by mouth 2 (two) times daily.   05/06/2016 at Unknown time  . rOPINIRole (REQUIP) 0.25 MG tablet Take 0.25 mg by mouth at bedtime.     . saxagliptin HCl (ONGLYZA) 2.5 MG TABS tablet Take 5 mg by mouth daily.     . simethicone (MYLICON) 125 MG chewable tablet Chew 125 mg by mouth 2 (two) times daily.     . simvastatin (ZOCOR) 20 MG tablet Take 20 mg by mouth daily.     . traZODone (DESYREL) 50 MG tablet Take 50 mg by mouth at bedtime.     Marland Kitchen  lipase/protease/amylase (CREON) 12000 units CPEP capsule Take 24,000 Units by mouth 2 (two) times daily.   Not Taking at Unknown time  . simvastatin (ZOCOR) 40 MG tablet Take 40 mg by mouth daily.   Not Taking at Unknown time  . traZODone (DESYREL) 100 MG tablet Take 100 mg by mouth at bedtime.   Not Taking at Unknown time     Allergies  Allergen Reactions  . Thorazine [Chlorpromazine]     Skin turns yellow     Past Medical History:  Diagnosis Date  . Anemia    iron deficiency  . Asthma   . Barrett's esophagus   . Depression   . Diabetes mellitus without complication (HCC)   . Dysphagia   . GERD (gastroesophageal reflux disease)   . Gout   . H/O chronic pancreatitis   . Hyperlipidemia   . Hypertension   . Hypothyroidism   . Insomnia   . Neuromuscular disorder (HCC)   . Paranoid schizophrenia (HCC)    . Psychogenic polydipsia   . Rosacea   . Schizophrenia (HCC)   . Shortness of breath dyspnea   . Sleep apnea   . Varices, esophageal (HCC)   . Vitamin D deficiency     Review of systems:      Physical Exam    Heart and lungs: Regular rate and rhythm without rub or gallop, lungs are bilaterally clear.    HEENT: Normocephalic atraumatic eyes are anicteric    Other:     Pertinant exam for procedure: Soft nontender nondistended bowel sounds positive normoactive.    Planned proceedures: EGD, colonoscopy and indicated procedures. I have discussed the risks benefits and complications of procedures to include not limited to bleeding, infection, perforation and the risk of sedation and the patient wishes to proceed.    Christena DeemMartin U Louana Fontenot, MD Gastroenterology 05/07/2016  8:54 AM

## 2016-05-07 NOTE — OR Nursing (Signed)
Colonoscopy aborted due to poor prep

## 2016-05-07 NOTE — Anesthesia Postprocedure Evaluation (Signed)
Anesthesia Post Note  Patient: Dalton Thomas.  Procedure(s) Performed: Procedure(s) (LRB): COLONOSCOPY WITH PROPOFOL (N/A) ESOPHAGOGASTRODUODENOSCOPY (EGD) WITH PROPOFOL (N/A)  Patient location during evaluation: Endoscopy Anesthesia Type: General Level of consciousness: awake and alert Pain management: pain level controlled Vital Signs Assessment: post-procedure vital signs reviewed and stable Respiratory status: spontaneous breathing, nonlabored ventilation, respiratory function stable and patient connected to nasal cannula oxygen Cardiovascular status: blood pressure returned to baseline and stable Postop Assessment: no signs of nausea or vomiting Anesthetic complications: no     Last Vitals:  Vitals:   05/07/16 1020 05/07/16 1030  BP: 129/63 115/62  Pulse: (!) 55 (!) 56  Resp: 15 17  Temp:      Last Pain:  Vitals:   05/07/16 1000  TempSrc: Tympanic                 Alexes Menchaca S

## 2016-05-07 NOTE — Transfer of Care (Signed)
Immediate Anesthesia Transfer of Care Note  Patient: Dalton HammedFred B Gehrig Jr.  Procedure(s) Performed: Procedure(s): COLONOSCOPY WITH PROPOFOL (N/A) ESOPHAGOGASTRODUODENOSCOPY (EGD) WITH PROPOFOL (N/A)  Patient Location: PACU  Anesthesia Type:General  Level of Consciousness: sedated  Airway & Oxygen Therapy: Patient Spontanous Breathing and Patient connected to nasal cannula oxygen  Post-op Assessment: Report given to RN and Post -op Vital signs reviewed and stable  Post vital signs: Reviewed and stable  Last Vitals:  Vitals:   05/07/16 0740 05/07/16 1000  BP: 123/63 (!) 94/42  Pulse: 66 (!) 55  Resp: 18 14  Temp: (!) 35.7 C (!) 36 C    Last Pain:  Vitals:   05/07/16 1000  TempSrc: Tympanic         Complications: No apparent anesthesia complications

## 2016-05-07 NOTE — Anesthesia Preprocedure Evaluation (Addendum)
Anesthesia Evaluation  Patient identified by MRN, date of birth, ID band Patient awake    Reviewed: Allergy & Precautions, NPO status , Patient's Chart, lab work & pertinent test results, reviewed documented beta blocker date and time   Airway Mallampati: III  TM Distance: >3 FB     Dental  (+) Chipped   Pulmonary shortness of breath, asthma , sleep apnea ,           Cardiovascular hypertension, Pt. on medications and Pt. on home beta blockers      Neuro/Psych PSYCHIATRIC DISORDERS Depression Schizophrenia  Neuromuscular disease    GI/Hepatic GERD  ,  Endo/Other  diabetes, Type 2Hypothyroidism   Renal/GU      Musculoskeletal   Abdominal   Peds  Hematology   Anesthesia Other Findings Gout. Does not use CPAP. Gout.  Reproductive/Obstetrics                            Anesthesia Physical Anesthesia Plan  ASA: III  Anesthesia Plan: General   Post-op Pain Management:    Induction: Intravenous  Airway Management Planned: Oral ETT  Additional Equipment:   Intra-op Plan:   Post-operative Plan:   Informed Consent: I have reviewed the patients History and Physical, chart, labs and discussed the procedure including the risks, benefits and alternatives for the proposed anesthesia with the patient or authorized representative who has indicated his/her understanding and acceptance.     Plan Discussed with: CRNA  Anesthesia Plan Comments:         Anesthesia Quick Evaluation

## 2016-05-07 NOTE — Anesthesia Post-op Follow-up Note (Cosign Needed)
Anesthesia QCDR form completed.        

## 2016-05-08 ENCOUNTER — Encounter: Payer: Self-pay | Admitting: Gastroenterology

## 2016-05-08 LAB — SURGICAL PATHOLOGY

## 2016-07-13 ENCOUNTER — Encounter: Payer: Self-pay | Admitting: *Deleted

## 2016-07-16 ENCOUNTER — Encounter
Admission: RE | Disposition: A | Discharge status: Home / Self Care | Payer: Self-pay | Source: Ambulatory Visit | Attending: Gastroenterology

## 2016-07-16 ENCOUNTER — Ambulatory Visit
Admission: RE | Admit: 2016-07-16 | Discharge: 2016-07-16 | Disposition: A | Discharge status: Home / Self Care | Payer: Medicare Other | Source: Ambulatory Visit | Attending: Gastroenterology | Admitting: Gastroenterology

## 2016-07-16 ENCOUNTER — Ambulatory Visit: Payer: Medicare Other | Admitting: Anesthesiology

## 2016-07-16 ENCOUNTER — Encounter: Payer: Self-pay | Admitting: *Deleted

## 2016-07-16 DIAGNOSIS — G4700 Insomnia, unspecified: Secondary | ICD-10-CM | POA: Diagnosis not present

## 2016-07-16 DIAGNOSIS — Z1211 Encounter for screening for malignant neoplasm of colon: Secondary | ICD-10-CM | POA: Diagnosis present

## 2016-07-16 DIAGNOSIS — D649 Anemia, unspecified: Secondary | ICD-10-CM | POA: Diagnosis not present

## 2016-07-16 DIAGNOSIS — E039 Hypothyroidism, unspecified: Secondary | ICD-10-CM | POA: Diagnosis not present

## 2016-07-16 DIAGNOSIS — Z7982 Long term (current) use of aspirin: Secondary | ICD-10-CM | POA: Insufficient documentation

## 2016-07-16 DIAGNOSIS — J45909 Unspecified asthma, uncomplicated: Secondary | ICD-10-CM | POA: Diagnosis not present

## 2016-07-16 DIAGNOSIS — Q438 Other specified congenital malformations of intestine: Secondary | ICD-10-CM | POA: Insufficient documentation

## 2016-07-16 DIAGNOSIS — K219 Gastro-esophageal reflux disease without esophagitis: Secondary | ICD-10-CM | POA: Insufficient documentation

## 2016-07-16 DIAGNOSIS — Z79899 Other long term (current) drug therapy: Secondary | ICD-10-CM | POA: Diagnosis not present

## 2016-07-16 DIAGNOSIS — F200 Paranoid schizophrenia: Secondary | ICD-10-CM | POA: Insufficient documentation

## 2016-07-16 DIAGNOSIS — I10 Essential (primary) hypertension: Secondary | ICD-10-CM | POA: Diagnosis not present

## 2016-07-16 DIAGNOSIS — E785 Hyperlipidemia, unspecified: Secondary | ICD-10-CM | POA: Insufficient documentation

## 2016-07-16 DIAGNOSIS — Z86010 Personal history of colonic polyps: Secondary | ICD-10-CM | POA: Insufficient documentation

## 2016-07-16 DIAGNOSIS — E119 Type 2 diabetes mellitus without complications: Secondary | ICD-10-CM | POA: Insufficient documentation

## 2016-07-16 DIAGNOSIS — G4730 Sleep apnea, unspecified: Secondary | ICD-10-CM | POA: Insufficient documentation

## 2016-07-16 DIAGNOSIS — Z794 Long term (current) use of insulin: Secondary | ICD-10-CM | POA: Diagnosis not present

## 2016-07-16 HISTORY — DX: Other specified abnormal findings of blood chemistry: R79.89

## 2016-07-16 HISTORY — DX: Acute embolism and thrombosis of other specified veins: I82.890

## 2016-07-16 HISTORY — PX: COLONOSCOPY: SHX5424

## 2016-07-16 LAB — GLUCOSE, CAPILLARY: Glucose-Capillary: 68 mg/dL (ref 65–99)

## 2016-07-16 SURGERY — COLONOSCOPY
Anesthesia: General

## 2016-07-16 MED ORDER — PHENYLEPHRINE HCL 10 MG/ML IJ SOLN
INTRAMUSCULAR | Status: AC
  Filled 2016-07-16: qty 1

## 2016-07-16 MED ORDER — PROPOFOL 10 MG/ML IV BOLUS
INTRAVENOUS | Status: DC | PRN
  Administered 2016-07-16: 60 mg via INTRAVENOUS
  Administered 2016-07-16: 10 mg via INTRAVENOUS

## 2016-07-16 MED ORDER — SODIUM CHLORIDE 0.9 % IV SOLN: INTRAVENOUS | Status: DC

## 2016-07-16 MED ORDER — PROPOFOL 500 MG/50ML IV EMUL
INTRAVENOUS | Status: AC
  Filled 2016-07-16: qty 50

## 2016-07-16 MED ORDER — SODIUM CHLORIDE 0.9 % IV SOLN
INTRAVENOUS | Status: DC
  Administered 2016-07-16 (×2): via INTRAVENOUS

## 2016-07-16 MED ORDER — PROPOFOL 500 MG/50ML IV EMUL
INTRAVENOUS | Status: DC | PRN
  Administered 2016-07-16: 175 ug/kg/min via INTRAVENOUS

## 2016-07-16 MED ORDER — PROPOFOL 10 MG/ML IV BOLUS
INTRAVENOUS | Status: AC
  Filled 2016-07-16: qty 20

## 2016-07-16 NOTE — Brief Op Note (Signed)
Anesthesia informed of FSBS of 68.

## 2016-07-16 NOTE — Op Note (Signed)
[facility] Gastroenterology Patient Name: Dalton Thomas Procedure Date: 07/16/2016 7:15 AM MRN: [id number] Account #: [id number] Date of Birth: 1955-08-03 Admit Type: Outpatient Age: 61 Room: [facility] ENDO ROOM 3 Gender: Male Note Status: Finalized Procedure:            Colonoscopy Indications:          Personal history of colonic polyps Providers:            Christena Deem, MD Medicines:            Monitored Anesthesia Care Complications:        No immediate complications. Procedure:            Pre-Anesthesia Assessment:                       - ASA Grade Assessment: III - A patient with severe                        systemic disease.                       After obtaining informed consent, the colonoscope was                        passed under direct vision. Throughout the procedure,                        the patient's blood pressure, pulse, and oxygen                        saturations were monitored continuously. The                        Colonoscope was introduced through the anus and                        advanced to the the cecum, identified by appendiceal                        orifice and ileocecal valve. The colonoscopy was                        unusually difficult due to poor bowel prep. Successful                        completion of the procedure was aided by using manual                        pressure, applying abdominal pressure and lavage. The                        patient tolerated the procedure well. The quality of                        the bowel preparation was fair. Findings:      The sigmoid colon, descending colon and transverse colon were       significantly redundant.      The exam was otherwise normal throughout the examined colon.      The retroflexed view of the distal rectum and anal verge was normal and  showed no anal or rectal abnormalities.      The digital rectal exam was normal. Impression:           - Preparation of  the colon was fair.                       - Redundant colon.                       - The distal rectum and anal verge are normal on                        retroflexion view.                       - No specimens collected. Recommendation:       - Discharge patient to home.                       - Repeat colonoscopy in 5 years for surveillance. Procedure Code(s):    --- Professional ---                       [id number], Colonoscopy, flexible; diagnostic, including                        collection of specimen(s) by brushing or washing, when                        performed (separate procedure) Diagnosis Code(s):    --- Professional ---                       Z86.010, Personal history of colonic polyps                       Q43.8, Other specified congenital malformations of                        intestine CPT copyright 2016 [employer]. All rights reserved. The codes documented in this report are preliminary and upon coder review may  be revised to meet current compliance requirements. Christena Deem, MD 07/16/2016 8:36:41 AM This report has been signed electronically. Number of Addenda: 0 Note Initiated On: 07/16/2016 7:15 AM Scope Withdrawal Time: 0 hours 6 minutes 42 seconds  Total Procedure Duration: 0 hours 35 minutes 17 seconds       [facility]

## 2016-07-16 NOTE — Anesthesia Post-op Follow-up Note (Cosign Needed)
Anesthesia QCDR form completed.        

## 2016-07-16 NOTE — Transfer of Care (Signed)
Immediate Anesthesia Transfer of Care Note  Patient: Dalton Thomas.  Procedure(s) Performed: Procedure(s): COLONOSCOPY (N/A)  Patient Location: PACU  Anesthesia Type:General  Level of Consciousness: sedated  Airway & Oxygen Therapy: Patient Spontanous Breathing and Patient connected to nasal cannula oxygen  Post-op Assessment: Report given to RN and Post -op Vital signs reviewed and stable  Post vital signs: Reviewed and stable  Last Vitals:  Vitals:   07/16/16 0655  BP: 132/67  Pulse: 75  Resp: 18  Temp: (!) 35.7 C    Last Pain:  Vitals:   07/16/16 0655  TempSrc: Tympanic         Complications: No apparent anesthesia complications

## 2016-07-16 NOTE — Anesthesia Preprocedure Evaluation (Signed)
Anesthesia Evaluation  Patient identified by MRN, date of birth, ID band Patient awake    Reviewed: Allergy & Precautions, NPO status , Patient's Chart, lab work & pertinent test results  History of Anesthesia Complications Negative for: history of anesthetic complications  Airway Mallampati: II  TM Distance: >3 FB Neck ROM: Full    Dental  (+) Missing, Poor Dentition   Pulmonary asthma , sleep apnea ,    breath sounds clear to auscultation- rhonchi (-) wheezing      Cardiovascular hypertension, Pt. on medications (-) CAD, (-) Past MI and (-) Cardiac Stents  Rhythm:Regular Rate:Normal - Systolic murmurs and - Diastolic murmurs    Neuro/Psych PSYCHIATRIC DISORDERS Depression Schizophrenia negative neurological ROS     GI/Hepatic Neg liver ROS, GERD  ,  Endo/Other  diabetes, Insulin DependentHypothyroidism   Renal/GU negative Renal ROS     Musculoskeletal negative musculoskeletal ROS (+)   Abdominal (+) - obese,   Peds  Hematology  (+) anemia ,   Anesthesia Other Findings Past Medical History: No date: Anemia     Comment: iron deficiency No date: Asthma No date: Barrett's esophagus No date: Depression No date: Diabetes mellitus without complication (HCC) No date: Dysphagia No date: GERD (gastroesophageal reflux disease) No date: Gout No date: H/O chronic pancreatitis No date: Hyperlipidemia No date: Hypertension No date: Hypothyroidism No date: Hypouricemia No date: Insomnia No date: Neuromuscular disorder (HCC) No date: Paranoid schizophrenia (HCC) No date: Paranoid schizophrenia (HCC) No date: Psychogenic polydipsia No date: Rosacea No date: Schizophrenia (HCC) No date: Shortness of breath dyspnea No date: Sleep apnea No date: Splenic vein thrombosis No date: Varices, esophageal (HCC) No date: Vitamin D deficiency   Reproductive/Obstetrics                              Anesthesia Physical Anesthesia Plan  ASA: III  Anesthesia Plan: General   Post-op Pain Management:    Induction: Intravenous  Airway Management Planned: Natural Airway  Additional Equipment:   Intra-op Plan:   Post-operative Plan:   Informed Consent: I have reviewed the patients History and Physical, chart, labs and discussed the procedure including the risks, benefits and alternatives for the proposed anesthesia with the patient or authorized representative who has indicated his/her understanding and acceptance.   Dental advisory given  Plan Discussed with: CRNA and Anesthesiologist  Anesthesia Plan Comments:         Anesthesia Quick Evaluation

## 2016-07-16 NOTE — Anesthesia Procedure Notes (Signed)
Date/Time: 07/16/2016 8:00 AM Performed by: Junious Silk Pre-anesthesia Checklist: Patient identified, Emergency Drugs available, Suction available, Patient being monitored and Timeout performed Oxygen Delivery Method: Nasal cannula

## 2016-07-16 NOTE — Anesthesia Postprocedure Evaluation (Signed)
Anesthesia Post Note  Patient: Dalton Thomas.  Procedure(s) Performed: Procedure(s) (LRB): COLONOSCOPY (N/A)  Patient location during evaluation: Endoscopy Anesthesia Type: General Level of consciousness: awake and alert Pain management: pain level controlled Vital Signs Assessment: post-procedure vital signs reviewed and stable Respiratory status: spontaneous breathing, nonlabored ventilation and respiratory function stable Cardiovascular status: blood pressure returned to baseline and stable Postop Assessment: no signs of nausea or vomiting Anesthetic complications: no     Last Vitals:  Vitals:   07/16/16 0842 07/16/16 0852  BP: 101/60 138/70  Pulse: 62 74  Resp: 12 16  Temp:      Last Pain:  Vitals:   07/16/16 0832  TempSrc: Tympanic                 Sacha Topor

## 2016-07-16 NOTE — H&P (Signed)
Outpatient short stay form Pre-procedure 07/16/2016 7:41 AM Christena Deem MD  Primary Physician: Dr Mortimer Fries  Reason for visit:  Colonoscopy  History of present illness:  Patient is a 61 year old male presenting today as above. There's an attempted colonoscopy couple of months ago however the prep was too poor for the procedure. He has tolerated his new prep. He takes no aspirin or blood thinning agents.  Patient does have a history of chronic pancreatitis with splenic vein varices however his clotting factors and platelet count are normal.  Current Facility-Administered Medications:  .  0.9 %  sodium chloride infusion, , Intravenous, Continuous, Christena Deem, MD .  0.9 %  sodium chloride infusion, , Intravenous, Continuous, Christena Deem, MD .  0.9 %  sodium chloride infusion, , Intravenous, Continuous, Christena Deem, MD  Prescriptions Prior to Admission  Medication Sig Dispense Refill Last Dose  . aspirin EC 81 MG tablet Take 81 mg by mouth daily.   Past Week at Unknown time  . cetirizine (ZYRTEC) 10 MG tablet Take 10 mg by mouth daily.   07/15/2016 at Unknown time  . cloZAPine (CLOZARIL) 100 MG tablet Take 100 mg by mouth at bedtime.   07/15/2016 at Unknown time  . esomeprazole (NEXIUM) 40 MG capsule Take 40 mg by mouth 2 (two) times daily before a meal.   07/15/2016 at Unknown time  . ferrous sulfate 325 (65 FE) MG tablet Take 325 mg by mouth daily with breakfast.   Past Week at Unknown time  . fluticasone furoate-vilanterol (BREO ELLIPTA) 100-25 MCG/INH AEPB Inhale 1 puff into the lungs daily.   07/15/2016 at Unknown time  . Iloperidone (FANAPT) 8 MG TABS Take by mouth 2 (two) times daily.   07/15/2016 at Unknown time  . Insulin Glargine (TOUJEO SOLOSTAR) 300 UNIT/ML SOPN Inject 28 Units into the skin daily.   07/15/2016 at Unknown time  . insulin lispro protamine-lispro (HUMALOG 50/50 MIX) (50-50) 100 UNIT/ML SUSP injection Inject 2 Units into the skin 3 (three) times daily.    07/15/2016 at Unknown time  . levothyroxine (SYNTHROID, LEVOTHROID) 75 MCG tablet Take 75 mcg by mouth daily before breakfast.   07/15/2016 at Unknown time  . lipase/protease/amylase (CREON) 12000 units CPEP capsule Take 24,000 Units by mouth 2 (two) times daily.   07/15/2016 at Unknown time  . losartan (COZAAR) 50 MG tablet Take 50 mg by mouth daily.   07/15/2016 at Unknown time  . Melatonin 5 MG CAPS Take by mouth.   07/15/2016 at Unknown time  . metFORMIN (GLUCOPHAGE) 1000 MG tablet Take 2,000 mg by mouth daily with breakfast.    07/15/2016 at Unknown time  . metoprolol (LOPRESSOR) 50 MG tablet Take 50 mg by mouth daily.    07/15/2016 at Unknown time  . Multiple Vitamin (THEREMS PO) Take by mouth daily at 2 PM.   Past Week at Unknown time  . Pancrelipase, Lip-Prot-Amyl, (CREON PO) Take 2 capsules by mouth QID.   07/15/2016 at Unknown time  . polyethylene glycol (MIRALAX / GLYCOLAX) packet Take 17 g by mouth 2 (two) times daily.   07/15/2016 at Unknown time  . saxagliptin HCl (ONGLYZA) 2.5 MG TABS tablet Take 5 mg by mouth daily.   07/15/2016 at Unknown time  . simethicone (MYLICON) 125 MG chewable tablet Chew 125 mg by mouth 2 (two) times daily.   07/15/2016 at Unknown time  . simvastatin (ZOCOR) 20 MG tablet Take 20 mg by mouth daily.   07/15/2016 at Unknown  time  . traZODone (DESYREL) 50 MG tablet Take 50 mg by mouth at bedtime.   07/15/2016 at Unknown time  . fluPHENAZine (PROLIXIN) 10 MG tablet Take 10 mg by mouth daily.     . metroNIDAZOLE (METROGEL) 0.75 % gel Apply 1 application topically 2 (two) times daily.     . Miconazole Nitrate (FUNGOID TINCTURE) 2 % SOLN Apply topically at bedtime.     Marland Kitchen rOPINIRole (REQUIP) 0.25 MG tablet Take 0.25 mg by mouth at bedtime.   Not Taking at Unknown time  . simvastatin (ZOCOR) 40 MG tablet Take 40 mg by mouth daily.   Not Taking at Unknown time  . [DISCONTINUED] traZODone (DESYREL) 100 MG tablet Take 100 mg by mouth at bedtime.   Not Taking at Unknown time     Allergies   Allergen Reactions  . Thorazine [Chlorpromazine]     Skin turns yellow     Past Medical History:  Diagnosis Date  . Anemia    iron deficiency  . Asthma   . Barrett's esophagus   . Depression   . Diabetes mellitus without complication (HCC)   . Dysphagia   . GERD (gastroesophageal reflux disease)   . Gout   . H/O chronic pancreatitis   . Hyperlipidemia   . Hypertension   . Hypothyroidism   . Hypouricemia   . Insomnia   . Neuromuscular disorder (HCC)   . Paranoid schizophrenia (HCC)   . Paranoid schizophrenia (HCC)   . Psychogenic polydipsia   . Rosacea   . Schizophrenia (HCC)   . Shortness of breath dyspnea   . Sleep apnea   . Splenic vein thrombosis   . Varices, esophageal (HCC)   . Vitamin D deficiency     Review of systems:      Physical Exam    Heart and lungs: Regular rate and rhythm without rub or gallop, lungs are bilaterally clear.    HEENT: Normocephalic atraumatic eyes are anicteric    Other:     Pertinant exam for procedure: Soft nontender nondistended bowel sounds positive normoactive    Planned proceedures: Colonoscopy and indicated procedures. I have discussed the risks benefits and complications of procedures to include not limited to bleeding, infection, perforation and the risk of sedation and the patient wishes to proceed.    Christena Deem, MD Gastroenterology 07/16/2016  7:41 AM

## 2016-07-17 ENCOUNTER — Encounter: Payer: Self-pay | Admitting: Gastroenterology

## 2017-06-13 IMAGING — CT CT CHEST W/ CM
2 of 4 series · 15 of 36 positions shown, 18 images · IV contrast (omnipaque)
Comparison: 01/21/2014

CLINICAL DATA: Followup chest x-ray.

EXAM:
CT CHEST WITH CONTRAST
TECHNIQUE: Multidetector CT imaging of the chest was performed during
intravenous contrast administration.
CONTRAST:  75mL OMNIPAQUE IOHEXOL 300 MG/ML  SOLN

[Series 3: lungs · axial · 0.68mm/px · z∈[-673,-398]mm · 12 of 65 slices shown, 15 images]
[im 5/65  mediastinal]
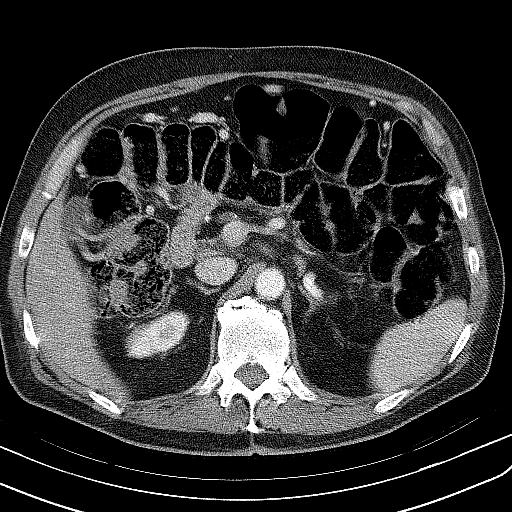
[im 5/65  lung]
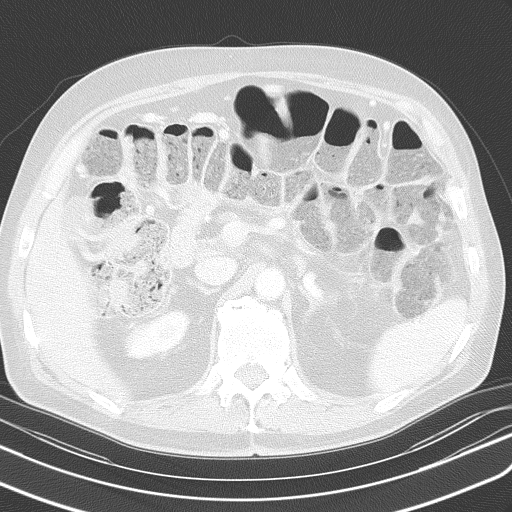
[im 10/65  lung]
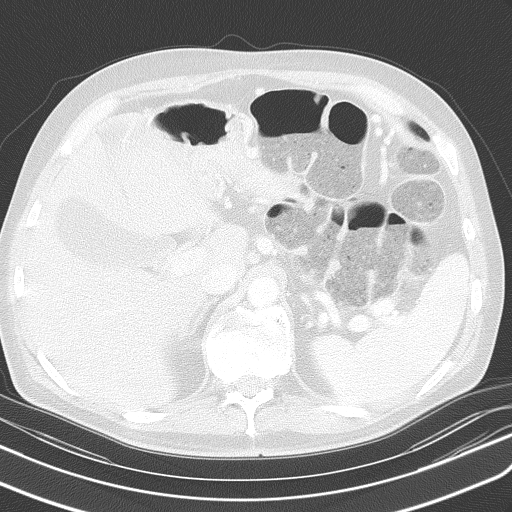
[im 15/65  lung]
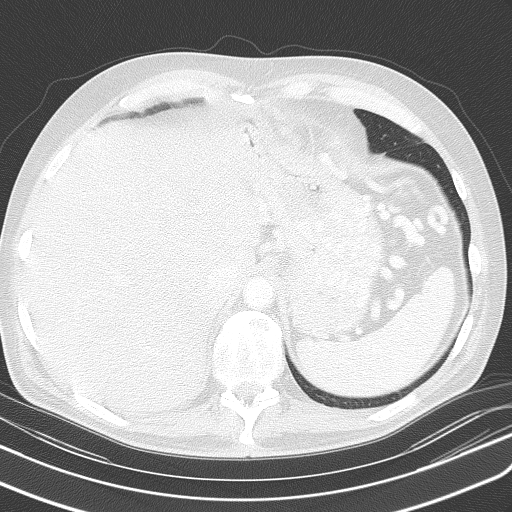
[im 20/65  lung]
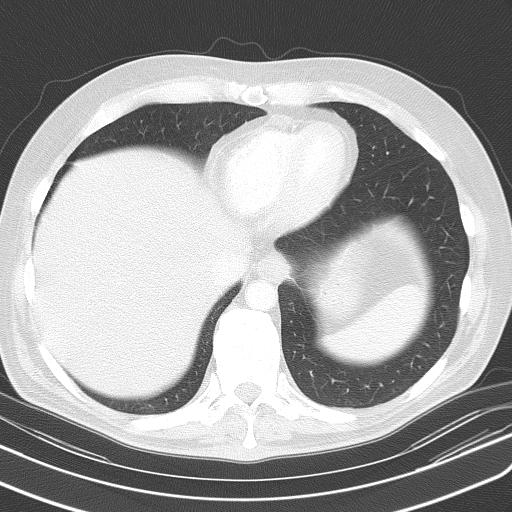
[im 25/65  mediastinal]
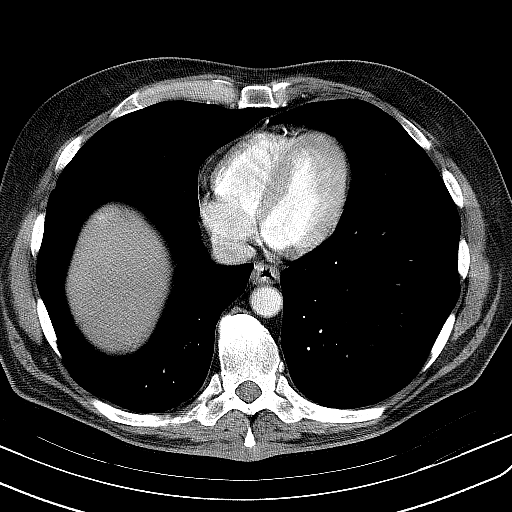
[im 25/65  lung]
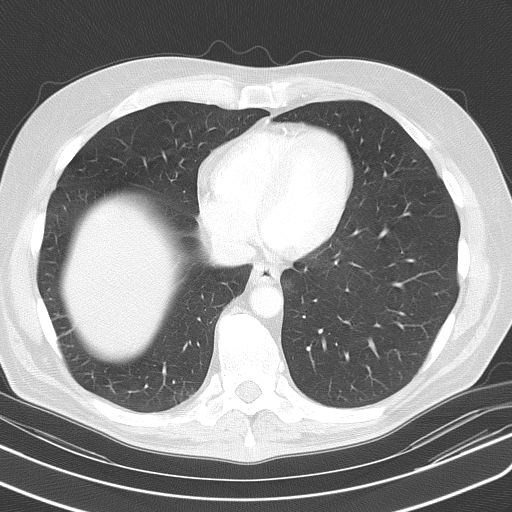
[im 30/65  lung]
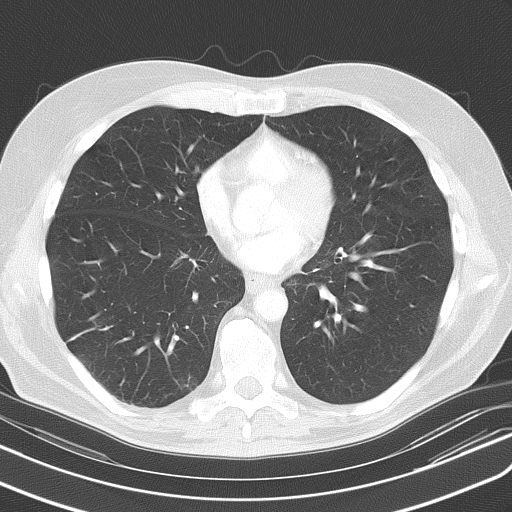
[im 35/65  lung]
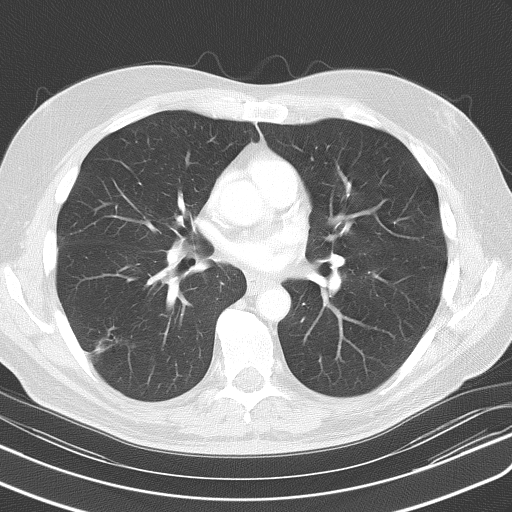
[im 40/65  lung]
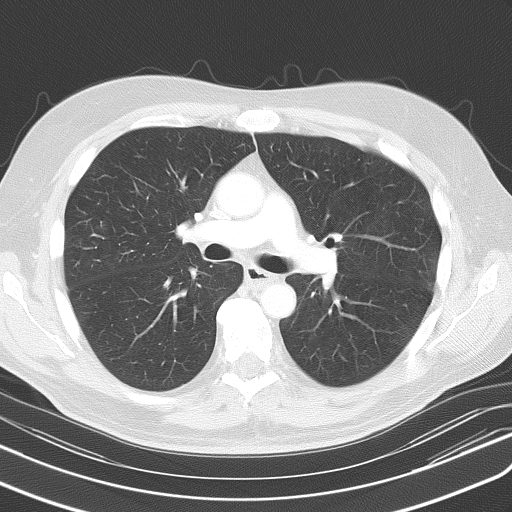
[im 45/65  mediastinal]
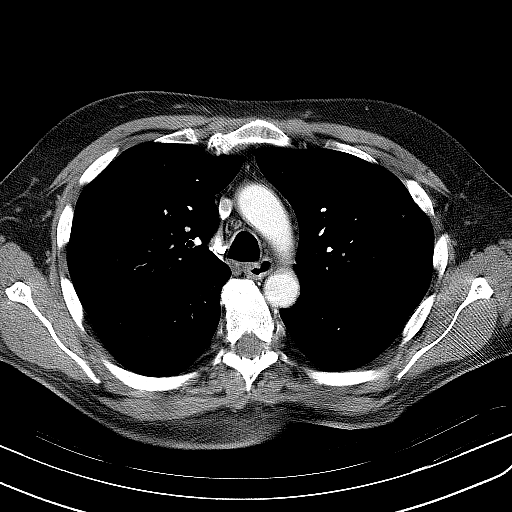
[im 45/65  lung]
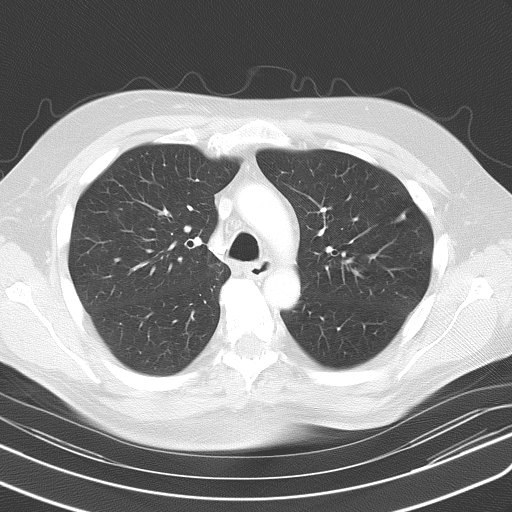
[im 50/65  lung]
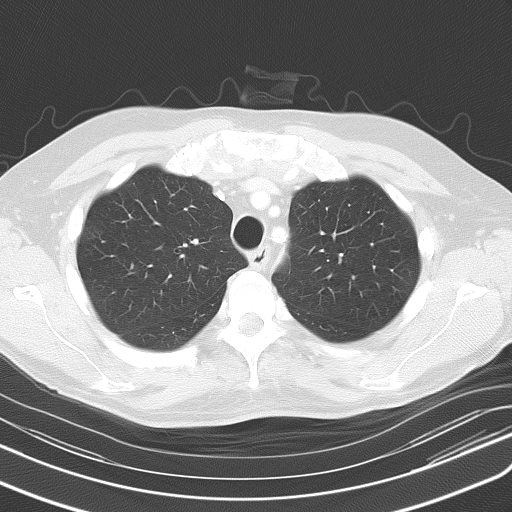
[im 55/65  lung]
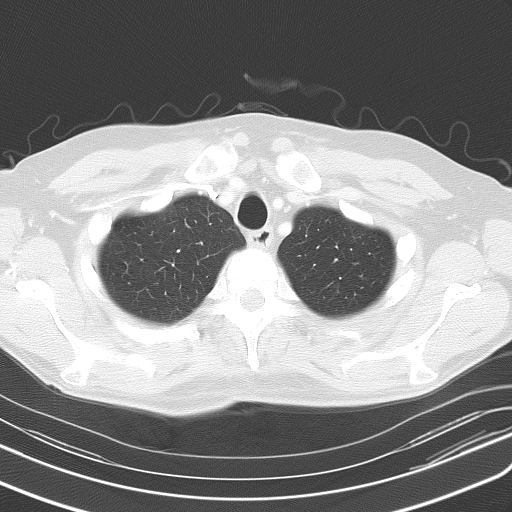
[im 60/65  lung]
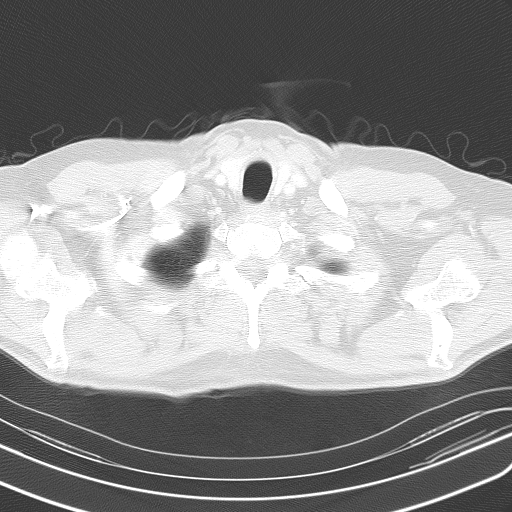

[Series 602: coronal · coronal · 0.68mm/px · 3 of 127 slices shown]
[im 26/127  lung]
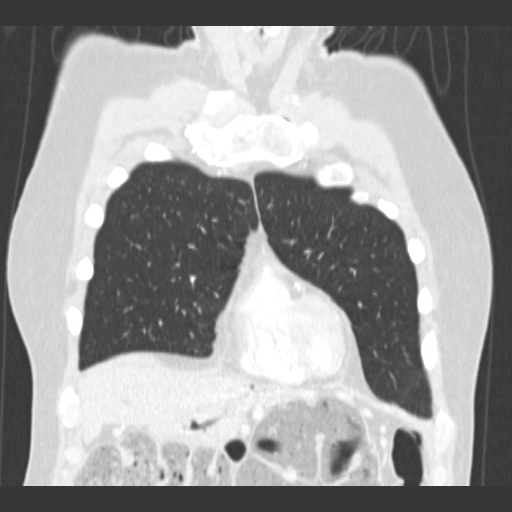
[im 51/127  lung]
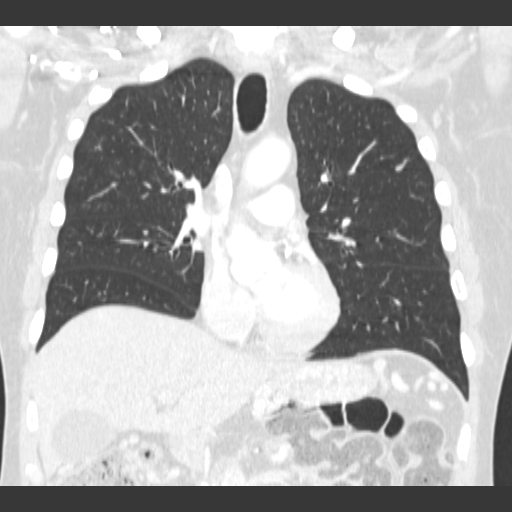
[im 76/127  lung]
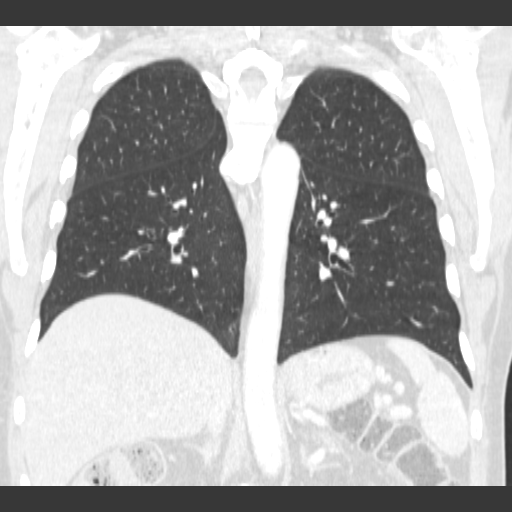

[15 of 36 positions shown; findings below may reference images not displayed]

FINDINGS: Mediastinum: The heart size is normal. No pericardial effusion
identified. The trachea is patent and midline. Unremarkable
appearance of the esophagus. No axillary or supraclavicular
adenopathy identified. Calcifications within the LAD coronary artery
noted.

Lungs/Pleura: No pleural fluid identified. There is no airspace
consolidation noted. Linear scar like density is noted within the
posterior right lower lobe. Within the left upper lobe there is a
nodular density which measure 5 x 10 mm, image 21 of series 3. This
is unchanged when compared with 01/21/2014 and appears more
confluent when compared with 09/02/2013. ,

Upper Abdomen: The visualized portions of the liver are on
unremarkable. Gallbladder negative. Changes of chronic pancreatitis
identified. Large left upper quadrant varix is identified which may
be secondary to chronic splenic vein thrombosis. The visualized
portions of the kidneys and adrenal glands are unremarkable.

Musculoskeletal: There is no aggressive lytic or sclerotic bone
lesion identified. Multi level spondylosis identified within the
thoracic spine. No aggressive lytic or sclerotic bone lesions
identified.
IMPRESSION: 1. Stable nodule in the left upper lobe is identified and is favored
to represent sequelae of inflammation or infection.
2. Scar like density is again noted in the right lower lobe.
3. Coronary artery calcifications
4. Large left upper quadrant varix.

## 2017-11-25 ENCOUNTER — Ambulatory Visit: Payer: Medicare Other | Admitting: Internal Medicine

## 2017-11-25 ENCOUNTER — Encounter: Payer: Self-pay | Admitting: Internal Medicine

## 2017-11-25 VITALS — BP 108/62 | HR 92 | Resp 16 | Ht 72.0 in | Wt 176.4 lb

## 2017-11-25 DIAGNOSIS — G4733 Obstructive sleep apnea (adult) (pediatric): Secondary | ICD-10-CM

## 2018-03-09 NOTE — Progress Notes (Signed)
[facility] [address] [place], [place] [address]  Pulmonary Sleep Medicine   Office Visit Note  Patient Name: Dalton HammedFred B Capurro Jr. DOB: [birth year] MRN [id number]  Date of Service: 11/25/2017  Complaints/HPI: Patient is doing relatively well has had no major issues with the OSA issues.  Patient has been on the CPAP device since about 2018 he has not been compliant.  He also tells me that he sleeps just fine.  ROS  General: (-) fever, (-) chills, (-) night sweats, (-) weakness Skin: (-) rashes, (-) itching,. Eyes: (-) visual changes, (-) redness, (-) itching. Nose and Sinuses: (-) nasal stuffiness or itchiness, (-) postnasal drip, (-) nosebleeds, (-) sinus trouble. Mouth and Throat: (-) sore throat, (-) hoarseness. Neck: (-) swollen glands, (-) enlarged thyroid, (-) neck pain. Respiratory: - cough, (-) bloody sputum, - shortness of breath, - wheezing. Cardiovascular: - ankle swelling, (-) chest pain. Lymphatic: (-) lymph node enlargement. Neurologic: (-) numbness, (-) tingling. Psychiatric: (-) anxiety, (-) depression   Current Medication: Outpatient Encounter Medications as of 11/25/2017  Medication Sig  . aspirin EC 81 MG tablet Take 81 mg by mouth daily.  . cetirizine (ZYRTEC) 10 MG tablet Take 10 mg by mouth daily.  . cloZAPine (CLOZARIL) 100 MG tablet Take 100 mg by mouth at bedtime.  Marland Kitchen. esomeprazole (NEXIUM) 40 MG capsule Take 40 mg by mouth 2 (two) times daily before a meal.  . Iloperidone (FANAPT) 8 MG TABS Take by mouth 2 (two) times daily.  . Insulin Glargine (TOUJEO SOLOSTAR) 300 UNIT/ML SOPN Inject 28 Units into the skin daily.  . insulin lispro protamine-lispro (HUMALOG 50/50 MIX) (50-50) 100 UNIT/ML SUSP injection Inject 2 Units into the skin 3 (three) times daily.  Marland Kitchen. levothyroxine (SYNTHROID, LEVOTHROID) 75 MCG tablet Take 75 mcg by mouth daily before breakfast.  . lipase/protease/amylase (CREON) 12000 units CPEP capsule Take 24,000 Units by mouth 2  (two) times daily.  Marland Kitchen. losartan (COZAAR) 50 MG tablet Take 50 mg by mouth daily.  . Melatonin 5 MG CAPS Take by mouth.  . metFORMIN (GLUCOPHAGE) 1000 MG tablet Take 2,000 mg by mouth daily with breakfast.   . metoprolol (LOPRESSOR) 50 MG tablet Take 50 mg by mouth daily.   . metroNIDAZOLE (METROGEL) 0.75 % gel Apply 1 application topically 2 (two) times daily.  . Miconazole Nitrate (FUNGOID TINCTURE) 2 % SOLN Apply topically at bedtime.  Marland Kitchen. oral electrolytes (THERMOTABS) TABS tablet Take by mouth.  . Pancrelipase, Lip-Prot-Amyl, (CREON PO) Take 2 capsules by mouth QID.  Marland Kitchen. pantoprazole (PROTONIX) 40 MG tablet Take 40 mg by mouth daily.  . polyethylene glycol (MIRALAX / GLYCOLAX) packet Take 17 g by mouth 2 (two) times daily.  . saxagliptin HCl (ONGLYZA) 2.5 MG TABS tablet Take 5 mg by mouth daily.  . simvastatin (ZOCOR) 20 MG tablet Take 20 mg by mouth daily.  . traZODone (DESYREL) 50 MG tablet Take 50 mg by mouth at bedtime.  . ferrous sulfate 325 (65 FE) MG tablet Take 325 mg by mouth daily with breakfast.  . fluPHENAZine (PROLIXIN) 10 MG tablet Take 10 mg by mouth daily.  . fluticasone furoate-vilanterol (BREO ELLIPTA) 100-25 MCG/INH AEPB Inhale 1 puff into the lungs daily.  . Multiple Vitamin (THEREMS PO) Take by mouth daily at 2 PM.  . rOPINIRole (REQUIP) 0.25 MG tablet Take 0.25 mg by mouth at bedtime.  . simethicone (MYLICON) 125 MG chewable tablet Chew 125 mg by mouth 2 (two) times daily.  . simvastatin (ZOCOR) 40 MG  tablet Take 40 mg by mouth daily.   No facility-administered encounter medications on file as of 11/25/2017.     Surgical History: Past Surgical History:  Procedure Laterality Date  . CATARACT EXTRACTION W/PHACO Right 09/07/2014   Procedure: CATARACT EXTRACTION PHACO AND INTRAOCULAR LENS PLACEMENT (IOC);  Surgeon: Galen Manila, MD;  Location: [facility] ORS;  Service: Ophthalmology;  Laterality: Right;  [place] 02:16 AP% 24.8 CDE 33.94  . COLONOSCOPY    . COLONOSCOPY N/A  07/16/2016   Procedure: COLONOSCOPY;  Surgeon: Christena Deem, MD;  Location: [facility] ENDOSCOPY;  Service: Endoscopy;  Laterality: N/A;  . COLONOSCOPY WITH PROPOFOL N/A 05/07/2016   Procedure: COLONOSCOPY WITH PROPOFOL;  Surgeon: Christena Deem, MD;  Location: [facility] ENDOSCOPY;  Service: Endoscopy;  Laterality: N/A;  . ESOPHAGOGASTRODUODENOSCOPY    . ESOPHAGOGASTRODUODENOSCOPY (EGD) WITH PROPOFOL N/A 05/07/2016   Procedure: ESOPHAGOGASTRODUODENOSCOPY (EGD) WITH PROPOFOL;  Surgeon: Christena Deem, MD;  Location: [facility] ENDOSCOPY;  Service: Endoscopy;  Laterality: N/A;  . EYE SURGERY    . TONSILLECTOMY      Medical History: Past Medical History:  Diagnosis Date  . Anemia    iron deficiency  . Asthma   . Barrett's esophagus   . Depression   . Diabetes mellitus without complication (HCC)   . Dysphagia   . GERD (gastroesophageal reflux disease)   . Gout   . H/O chronic pancreatitis   . Hyperlipidemia   . Hypertension   . Hypothyroidism   . Hypouricemia   . Insomnia   . Neuromuscular disorder (HCC)   . Paranoid schizophrenia (HCC)   . Paranoid schizophrenia (HCC)   . Psychogenic polydipsia   . Rosacea   . Schizophrenia (HCC)   . Shortness of breath dyspnea   . Sleep apnea   . Splenic vein thrombosis   . Varices, esophageal (HCC)   . Vitamin D deficiency     Family History: Family History  Problem Relation Age of Onset  . Diabetes Maternal Grandfather     Social History: Social History   Socioeconomic History  . Marital status: Single    Spouse name: Not on file  . Number of children: Not on file  . Years of education: Not on file  . Highest education level: Not on file  Occupational History  . Not on file  Social Needs  . Financial resource strain: Not on file  . Food insecurity:    Worry: Not on file    Inability: Not on file  . Transportation needs:    Medical: Not on file    Non-medical: Not on file  Tobacco Use  . Smoking status: Never Smoker  .  Smokeless tobacco: Never Used  Substance and Sexual Activity  . Alcohol use: No  . Drug use: No  . Sexual activity: Not on file  Lifestyle  . Physical activity:    Days per week: Not on file    Minutes per session: Not on file  . Stress: Not on file  Relationships  . Social connections:    Talks on phone: Not on file    Gets together: Not on file    Attends religious service: Not on file    Active member of club or organization: Not on file    Attends meetings of clubs or organizations: Not on file    Relationship status: Not on file  . Intimate partner violence:    Fear of current or ex partner: Not on file    Emotionally abused: Not on file  Physically abused: Not on file    Forced sexual activity: Not on file  Other Topics Concern  . Not on file  Social History Narrative  . Not on file    Vital Signs: Blood pressure 108/62, pulse 92, resp. rate 16, height 6' (1.829 m), weight 176 lb 6.4 oz (80 kg), SpO2 98 %.  Examination: General Appearance: The patient is well-developed, well-nourished, and in no distress. Skin: Gross inspection of skin unremarkable. Head: normocephalic, no gross deformities. Eyes: no gross deformities noted. ENT: ears appear grossly normal no exudates. Neck: Supple. No thyromegaly. No LAD. Respiratory: No rhonchi no rales are noted at this time. Cardiovascular: Normal S1 and S2 without murmur or rub. Extremities: No cyanosis. pulses are equal. Neurologic: Alert and oriented. No involuntary movements.  LABS: No results found for this or any previous visit (from the past 2160 hour(s)).  Radiology: No results found.  No results found.  No results found.    Assessment and Plan: There are no active problems to display for this patient.   1. Obstructive sleep apnea patient has not been compliant with his CPAP device.  Once again discussion of compliance with the CPAP was opened up.  Patient was encouraged to show compliance.  He would  rather reconsider a sleep study to reassess whether he actually needs the CPAP device.  General Counseling: I have discussed the findings of the evaluation and examination with Merlyn Albert.  I have also discussed any further diagnostic evaluation thatmay be needed or ordered today. Ayden verbalizes understanding of the findings of todays visit. We also reviewed his medications today and discussed drug interactions and side effects including but not limited excessive drowsiness and altered mental states. We also discussed that there is always a risk not just to him but also people around him. he has been encouraged to call the office with any questions or concerns that should arise related to todays visit.    Time spent: 15  I have personally obtained a history, examined the patient, evaluated laboratory and imaging results, formulated the assessment and plan and placed orders.    Yevonne Pax, MD [facility] Pulmonary and Critical Care Sleep medicine

## 2018-05-27 ENCOUNTER — Encounter: Payer: Self-pay | Admitting: Internal Medicine

## 2018-05-27 ENCOUNTER — Ambulatory Visit: Payer: Medicare Other | Admitting: Internal Medicine

## 2018-05-27 VITALS — BP 102/78 | HR 88 | Resp 16 | Ht 73.0 in | Wt 178.0 lb

## 2018-05-27 DIAGNOSIS — R0602 Shortness of breath: Secondary | ICD-10-CM

## 2018-05-27 DIAGNOSIS — G4733 Obstructive sleep apnea (adult) (pediatric): Secondary | ICD-10-CM | POA: Diagnosis not present

## 2018-05-27 NOTE — Progress Notes (Signed)
[facility] [address] [place], [place] [address]  Pulmonary Sleep Medicine   Office Visit Note  Patient Name: Dalton Thomas. DOB: 1955-12-06 MRN [id number]  Date of Service: 05/27/2018  Complaints/HPI: OSA. He has not been on CPAP since 2018. Apparently has not been using because he changed his place of residence. Patient states he sleeps fine. At the previous facility he was using the CPAP. He staets that since 2018 he has not been using it. Now interested to reassess  ROS  General: (-) fever, (-) chills, (-) night sweats, (-) weakness Skin: (-) rashes, (-) itching,. Eyes: (-) visual changes, (-) redness, (-) itching. Nose and Sinuses: (-) nasal stuffiness or itchiness, (-) postnasal drip, (-) nosebleeds, (-) sinus trouble. Mouth and Throat: (-) sore throat, (-) hoarseness. Neck: (-) swollen glands, (-) enlarged thyroid, (-) neck pain. Respiratory: - cough, (-) bloody sputum, - shortness of breath, - wheezing. Cardiovascular: - ankle swelling, (-) chest pain. Lymphatic: (-) lymph node enlargement. Neurologic: (-) numbness, (-) tingling. Psychiatric: (-) anxiety, (-) depression   Current Medication: Outpatient Encounter Medications as of 05/27/2018  Medication Sig  . acetaminophen (TYLENOL) 325 MG tablet Take 650 mg by mouth every 6 (six) hours as needed.  Marland Kitchen aspirin EC 81 MG tablet Take 81 mg by mouth daily.  . cetirizine (ZYRTEC) 10 MG tablet Take 10 mg by mouth daily.  . cloZAPine (CLOZARIL) 100 MG tablet Take 100 mg by mouth at bedtime.  Marland Kitchen guaiFENesin-dextromethorphan (ROBITUSSIN DM) 100-10 MG/5ML syrup Take 5 mLs by mouth every 4 (four) hours as needed for cough.  . Iloperidone (FANAPT) 8 MG TABS Take by mouth 2 (two) times daily.  . Insulin Glargine (TOUJEO SOLOSTAR) 300 UNIT/ML SOPN Inject 28 Units into the skin daily.  . insulin lispro protamine-lispro (HUMALOG 50/50 MIX) (50-50) 100 UNIT/ML SUSP injection Inject 2 Units into the skin 3 (three) times  daily.  Marland Kitchen levothyroxine (SYNTHROID, LEVOTHROID) 75 MCG tablet Take 75 mcg by mouth daily before breakfast.  . lipase/protease/amylase (CREON) 12000 units CPEP capsule Take 24,000 Units by mouth 2 (two) times daily.  Marland Kitchen loperamide (IMODIUM) 2 MG capsule Take 2 mg by mouth as needed for diarrhea or loose stools.  Marland Kitchen losartan (COZAAR) 100 MG tablet Take 100 mg by mouth daily.  Marland Kitchen losartan (COZAAR) 50 MG tablet Take 50 mg by mouth daily.  . Melatonin 5 MG CAPS Take by mouth.  . metFORMIN (GLUCOPHAGE) 1000 MG tablet Take 2,000 mg by mouth daily with breakfast.   . metoprolol (LOPRESSOR) 50 MG tablet Take 50 mg by mouth daily.   . metroNIDAZOLE (METROGEL) 0.75 % gel Apply 1 application topically 2 (two) times daily.  Marland Kitchen oral electrolytes (THERMOTABS) TABS tablet Take by mouth.  . oral electrolytes (THERMOTABS) TABS tablet Take by mouth.  . pantoprazole (PROTONIX) 40 MG tablet Take 40 mg by mouth daily.  . simvastatin (ZOCOR) 20 MG tablet Take 20 mg by mouth daily.  . simvastatin (ZOCOR) 40 MG tablet Take 40 mg by mouth daily.  . sitaGLIPtin (JANUVIA) 100 MG tablet Take 100 mg by mouth daily.  . traZODone (DESYREL) 50 MG tablet Take 50 mg by mouth at bedtime.  . fluticasone furoate-vilanterol (BREO ELLIPTA) 100-25 MCG/INH AEPB Inhale 1 puff into the lungs daily.  . Multiple Vitamin (THEREMS PO) Take by mouth daily at 2 PM.  . Pancrelipase, Lip-Prot-Amyl, (CREON PO) Take 2 capsules by mouth QID.  Marland Kitchen polyethylene glycol (MIRALAX / GLYCOLAX) packet Take 17 g by mouth 2 (  two) times daily.  Marland Kitchen. rOPINIRole (REQUIP) 0.25 MG tablet Take 0.25 mg by mouth at bedtime.  . [DISCONTINUED] esomeprazole (NEXIUM) 40 MG capsule Take 40 mg by mouth 2 (two) times daily before a meal.  . [DISCONTINUED] ferrous sulfate 325 (65 FE) MG tablet Take 325 mg by mouth daily with breakfast.  . [DISCONTINUED] fluPHENAZine (PROLIXIN) 10 MG tablet Take 10 mg by mouth daily.  . [DISCONTINUED] Miconazole Nitrate (FUNGOID TINCTURE) 2 % SOLN  Apply topically at bedtime.  . [DISCONTINUED] saxagliptin HCl (ONGLYZA) 2.5 MG TABS tablet Take 5 mg by mouth daily.  . [DISCONTINUED] simethicone (MYLICON) 125 MG chewable tablet Chew 125 mg by mouth 2 (two) times daily.   No facility-administered encounter medications on file as of 05/27/2018.     Surgical History: Past Surgical History:  Procedure Laterality Date  . CATARACT EXTRACTION W/PHACO Right 09/07/2014   Procedure: CATARACT EXTRACTION PHACO AND INTRAOCULAR LENS PLACEMENT (IOC);  Surgeon: Galen ManilaWilliam Porfilio, MD;  Location: [facility] ORS;  Service: Ophthalmology;  Laterality: Right;  [place] 02:16 AP% 24.8 CDE 33.94  . COLONOSCOPY    . COLONOSCOPY N/A 07/16/2016   Procedure: COLONOSCOPY;  Surgeon: Christena DeemMartin U Skulskie, MD;  [facility];  Service: Endoscopy;  Laterality: N/A;  . COLONOSCOPY WITH PROPOFOL N/A 05/07/2016   Procedure: COLONOSCOPY WITH PROPOFOL;  Surgeon: Christena DeemMartin U Skulskie, MD;  [facility] END[facility];  Service: Endoscopy;  Laterality: N/A;  . ESOPHAGOGASTRODUODENOSCOPY    . ESOPHAGOGASTRODUODENOSCOPY (EGD) WITH PROPOFOL N/A 05/07/2016   Procedure: ESOPHAGOGASTRODUODENOSCOPY (EGD) WITH PROPOFOL;  Surgeon: Christena DeemMartin U Skulskie, MD;  [facility];  Service: Endoscopy;  Laterality: N/A;  . EYE SURGERY    . TONSILLECTOMY      Medical History: Past Medical History:  Diagnosis Date  . Anemia    iron deficiency  . Asthma   . Barrett's esophagus   . Depression   . Diabetes mellitus without complication (HCC)   . Dysphagia   . GERD (gastroesophageal reflux disease)   . Gout   . H/O chronic pancreatitis   . Hyperlipidemia   . Hypertension   . Hypothyroidism   . Hypouricemia   . Insomnia   . Neuromuscular disorder (HCC)   . Paranoid schizophrenia (HCC)   . Paranoid schizophrenia (HCC)   . Psychogenic polydipsia   . Rosacea   . Schizophrenia (HCC)   . Shortness of breath dyspnea   . Sleep apnea   . Splenic vein thrombosis   . Varices, esophageal (HCC)    . Vitamin D deficiency     Family History: Family History  Problem Relation Age of Onset  . Diabetes Maternal Grandfather     Social History: Social History   Socioeconomic History  . Marital status: Single    Spouse name: Not on file  . Number of children: Not on file  . Years of education: Not on file  . Highest education level: Not on file  Occupational History  . Not on file  Social Needs  . Financial resource strain: Not on file  . Food insecurity:    Worry: Not on file    Inability: Not on file  . Transportation needs:    Medical: Not on file    Non-medical: Not on file  Tobacco Use  . Smoking status: Never Smoker  . Smokeless tobacco: Never Used  Substance and Sexual Activity  . Alcohol use: No  . Drug use: No  . Sexual activity: Not on file  Lifestyle  . Physical activity:  Days per week: Not on file    Minutes per session: Not on file  . Stress: Not on file  Relationships  . Social connections:    Talks on phone: Not on file    Gets together: Not on file    Attends religious service: Not on file    Active member of club or organization: Not on file    Attends meetings of clubs or organizations: Not on file    Relationship status: Not on file  . Intimate partner violence:    Fear of current or ex partner: Not on file    Emotionally abused: Not on file    Physically abused: Not on file    Forced sexual activity: Not on file  Other Topics Concern  . Not on file  Social History Narrative  . Not on file    Vital Signs: Blood pressure 102/78, pulse 88, resp. rate 16, height 6\' 1"  (1.854 m), weight 178 lb (80.7 kg), SpO2 98 %.  Examination: General Appearance: The patient is well-developed, well-nourished, and in no distress. Skin: Gross inspection of skin unremarkable. Head: normocephalic, no gross deformities. Eyes: no gross deformities noted. ENT: ears appear grossly normal no exudates. Neck: Supple. No thyromegaly. No LAD. Respiratory: no  rhonchi noted. Cardiovascular: Normal S1 and S2 without murmur or rub. Extremities: No cyanosis. pulses are equal. Neurologic: Alert and oriented. No involuntary movements.  LABS: No results found for this or any previous visit (from the past 2160 hour(s)).  Radiology: No results found.  No results found.  No results found.    Assessment and Plan: Patient Active Problem List   Diagnosis Date Noted  . Obstructive sleep apnea     1. OSA patient has been noncompliant with CPAP device once again spoke with the caregiver regarding importance of compliance.  Follow-up sleep study at home will be done to determine whether the patient still has significant OSA.  General Counseling: I have discussed the findings of the evaluation and examination with Dalton Thomas.  I have also discussed any further diagnostic evaluation thatmay be needed or ordered today. Dalton Thomas verbalizes understanding of the findings of todays visit. We also reviewed his medications today and discussed drug interactions and side effects including but not limited excessive drowsiness and altered mental states. We also discussed that there is always a risk not just to him but also people around him. he has been encouraged to call the office with any questions or concerns that should arise related to todays visit.    Time spent:  I have personally obtained a history, examined the patient, evaluated laboratory and imaging results, formulated the assessment and plan and placed orders.    Yevonne Pax, MD [facility] Pulmonary and Critical Care Sleep medicine

## 2018-05-27 NOTE — Patient Instructions (Signed)

## 2018-06-03 ENCOUNTER — Ambulatory Visit: Payer: Self-pay | Admitting: Internal Medicine

## 2018-06-11 ENCOUNTER — Other Ambulatory Visit: Payer: Medicare Other | Admitting: Internal Medicine

## 2018-06-11 DIAGNOSIS — G4710 Hypersomnia, unspecified: Secondary | ICD-10-CM

## 2018-06-11 NOTE — Progress Notes (Signed)
Patient and aide was given home sleep study machine and written and verbal instructions , will return machine tomorrow

## 2018-06-18 ENCOUNTER — Ambulatory Visit: Payer: Medicare Other | Admitting: Internal Medicine

## 2018-06-18 DIAGNOSIS — R0602 Shortness of breath: Secondary | ICD-10-CM | POA: Diagnosis not present

## 2018-06-18 LAB — PULMONARY FUNCTION TEST

## 2018-06-22 NOTE — Procedures (Signed)
[facility] [address] [place], [place] [address]  Sleep Specialist: Yevonne Pax, MD [facility]  Home Sleep Study Interpretation  Patient Name: Dalton Thomas. Patient MR EVOJJK:[id number] DOB:1955-11-18  Date of Study: June 11, 2018  Indications for study: Hypersomnia excessive sleepiness  BMI: 23.5 kg/m       Respiratory Data:  Total AHI: 1.8/h  Total Obstructive Apneas: 0  Total Central Apneas: 6  Total Mixed Apneas: 0  Total Hypopneas: 2  If the AHI is greater than 5 per hour patient qualifies for PAP evaluation  Oximetry Data:  Oxygen Desaturation Index: 0.2  Lowest Desaturation: 87%  Cardiac Data:  Minimum Heart Rate: 63  Maximum Heart Rate: 112   Impression / Diagnosis:  This apnea study does not show presence of significant obstructive sleep apnea.  Patient has minor oxygen desaturations noted.  If symptoms of hypersomnia persist consider an MSLT for further evaluation clinical correlation is recommended.  GENERAL Recommendations:  1.  Consider Auto PAP with pressure ranges 5-20 cmH20 with download, or facility based PAP Titration Study  2.  Consider PAP interface mask fitted for patient comfort, Heated Humidification & PAP compliance monitoring (1 month, 3 months & 12 months after PAP initiation)  3. Consider treatment with mandibular advancement splint (MAS) or referral to an ENT surgeon for modification to the upper airway if the patient prefers an alternate therapy or the PAP trial is unsuccessful  4. Sleep hygiene measures should be discussed with the patient  5. Behavioral therapy such as weight reduction or smoking cessation as appropriate for the patient  6. Advise patient against the use of alcohol or sedatives in so much as these substances can worsen excessive daytime sleepiness and respiratory disturbances of sleep  7. Advise patient against participating in potentially dangerous activities while drowsy such as  operating a motor vehicle, heavy equipment or power tools as it can put them and others in danger  8. Advise patient of the long term consequences of OSA if left untreated, need for treatment and close follow up  9. Clinical follow up as deemed necessary     This Level III home sleep study was performed using the [employer], a 4 channel screening device subject to limitations. Depending on actual total sleep time, not measured in this study, the AHI (sum of apneas and hypopneas/hr of sleep) and therefore the severity of sleep apnea may be underestimated. As with any single night study, including Level 1 attended PSG, severity of sleep apnea may also be underestimated due to the lack of supine and/or REM sleep.  The interpretation associated with this report is based on normal values and degrees of severity in accordance with AASM parameters and/or estimated from multiple sources in the literature for adults ages 47-80+. These may not agree with the displayed values. The patient's treating physician should use the interpretation and recommendations in conjunction with the overall clinical evaluation and treatment of the patient.  Some of the terminology used in this scored ApneaLink report was developed several years ago and may not always be in accordance with current nomenclature. This in no way affects the accuracy of the data or the reliability of the interpretation and recommendations.

## 2018-06-23 NOTE — Procedures (Signed)
[facility] [address] [place], [address]  DATE OF SERVICE: June 18, 2018  Complete Pulmonary Function Testing Interpretation:  FINDINGS:  The forced vital capacity is normal.  The FEV1 is 3.58 L which is 92% of predicted and is normal.  Postbronchodilator there is no significant change in the FEV1.  FEV1 FEC ratio is normal.  Total lung capacity is within normal range residual volume is mildly decreased residual volume total lung capacity ratio is decreased total lung capacity is normal DLCO is mildly decreased and normal when corrected for alveolar volume.  IMPRESSION:  This pulmonary function study is within normal limits.  There is no significant response to bronchodilator therapy clinical correlation is recommended  Yevonne Pax, MD [facility] Pulmonary Critical Care Medicine Sleep Medicine

## 2018-06-26 ENCOUNTER — Ambulatory Visit: Payer: Medicare Other | Admitting: Adult Health

## 2018-06-26 ENCOUNTER — Other Ambulatory Visit: Payer: Self-pay

## 2018-06-26 ENCOUNTER — Encounter: Payer: Self-pay | Admitting: Adult Health

## 2018-06-26 VITALS — BP 116/62 | HR 89 | Resp 16 | Ht 73.0 in | Wt 179.0 lb

## 2018-06-26 DIAGNOSIS — G4710 Hypersomnia, unspecified: Secondary | ICD-10-CM | POA: Diagnosis not present

## 2018-06-26 DIAGNOSIS — R0602 Shortness of breath: Secondary | ICD-10-CM

## 2018-06-26 NOTE — Patient Instructions (Signed)

## 2018-06-26 NOTE — Progress Notes (Signed)
[facility] [address] [place], [place] [address]  Pulmonary Sleep Medicine   Office Visit Note  Patient Name: Dalton Thomas. DOB: 10-23-1955 MRN [id number]  Date of Service: 06/26/2018  Complaints/HPI: Pt is here for follow up on PFT and sleep study.  His sleep study shows and AHI of 1.8 and shows no significant oxygen desaturation or brady cardia.  He lives in a family care home, and is accompanied by a staff member.  They report that the patient has not used a cpap machine and the previous home he was at did not have a cpap for him either.  His PFT, was WNL as well.   ROS  General: (-) fever, (-) chills, (-) night sweats, (-) weakness Skin: (-) rashes, (-) itching,. Eyes: (-) visual changes, (-) redness, (-) itching. Nose and Sinuses: (-) nasal stuffiness or itchiness, (-) postnasal drip, (-) nosebleeds, (-) sinus trouble. Mouth and Throat: (-) sore throat, (-) hoarseness. Neck: (-) swollen glands, (-) enlarged thyroid, (-) neck pain. Respiratory: - cough, (-) bloody sputum, - shortness of breath, - wheezing. Cardiovascular: - ankle swelling, (-) chest pain. Lymphatic: (-) lymph node enlargement. Neurologic: (-) numbness, (-) tingling. Psychiatric: (-) anxiety, (-) depression   Current Medication: Outpatient Encounter Medications as of 06/26/2018  Medication Sig  . acetaminophen (TYLENOL) 325 MG tablet Take 650 mg by mouth every 6 (six) hours as needed.  Marland Kitchen aspirin EC 81 MG tablet Take 81 mg by mouth daily.  . cetirizine (ZYRTEC) 10 MG tablet Take 10 mg by mouth daily.  . cloZAPine (CLOZARIL) 100 MG tablet Take 100 mg by mouth at bedtime.  . fluticasone furoate-vilanterol (BREO ELLIPTA) 100-25 MCG/INH AEPB Inhale 1 puff into the lungs daily.  Marland Kitchen guaiFENesin-dextromethorphan (ROBITUSSIN DM) 100-10 MG/5ML syrup Take 5 mLs by mouth every 4 (four) hours as needed for cough.  . Iloperidone (FANAPT) 8 MG TABS Take by mouth 2 (two) times daily.  . Insulin Glargine  (TOUJEO SOLOSTAR) 300 UNIT/ML SOPN Inject 28 Units into the skin daily.  . insulin lispro protamine-lispro (HUMALOG 50/50 MIX) (50-50) 100 UNIT/ML SUSP injection Inject 2 Units into the skin 3 (three) times daily.  Marland Kitchen levothyroxine (SYNTHROID, LEVOTHROID) 75 MCG tablet Take 75 mcg by mouth daily before breakfast.  . lipase/protease/amylase (CREON) 12000 units CPEP capsule Take 24,000 Units by mouth 2 (two) times daily.  Marland Kitchen loperamide (IMODIUM) 2 MG capsule Take 2 mg by mouth as needed for diarrhea or loose stools.  Marland Kitchen losartan (COZAAR) 100 MG tablet Take 100 mg by mouth daily.  Marland Kitchen losartan (COZAAR) 50 MG tablet Take 50 mg by mouth daily.  . Melatonin 5 MG CAPS Take by mouth.  . metFORMIN (GLUCOPHAGE) 1000 MG tablet Take 2,000 mg by mouth daily with breakfast.   . metoprolol (LOPRESSOR) 50 MG tablet Take 50 mg by mouth daily.   . metroNIDAZOLE (METROGEL) 0.75 % gel Apply 1 application topically 2 (two) times daily.  . Multiple Vitamin (THEREMS PO) Take by mouth daily at 2 PM.  . oral electrolytes (THERMOTABS) TABS tablet Take by mouth.  . oral electrolytes (THERMOTABS) TABS tablet Take by mouth.  . Pancrelipase, Lip-Prot-Amyl, (CREON PO) Take 2 capsules by mouth QID.  Marland Kitchen pantoprazole (PROTONIX) 40 MG tablet Take 40 mg by mouth daily.  . polyethylene glycol (MIRALAX / GLYCOLAX) packet Take 17 g by mouth 2 (two) times daily.  Marland Kitchen rOPINIRole (REQUIP) 0.25 MG tablet Take 0.25 mg by mouth at bedtime.  . simvastatin (ZOCOR) 20 MG tablet  Take 20 mg by mouth daily.  . simvastatin (ZOCOR) 40 MG tablet Take 40 mg by mouth daily.  . sitaGLIPtin (JANUVIA) 100 MG tablet Take 100 mg by mouth daily.  . traZODone (DESYREL) 50 MG tablet Take 50 mg by mouth at bedtime.   No facility-administered encounter medications on file as of 06/26/2018.     Surgical History: Past Surgical History:  Procedure Laterality Date  . CATARACT EXTRACTION W/PHACO Right 09/07/2014   Procedure: CATARACT EXTRACTION PHACO AND INTRAOCULAR  LENS PLACEMENT (IOC);  Surgeon: Galen Manila, MD;  Location: [facility] ORS;  Service: Ophthalmology;  Laterality: Right;  [place] 02:16 AP% 24.8 CDE 33.94  . COLONOSCOPY    . COLONOSCOPY N/A 07/16/2016   Procedure: COLONOSCOPY;  Surgeon: Christena Deem, MD;  Location: [facility] ENDOSCOPY;  Service: Endoscopy;  Laterality: N/A;  . COLONOSCOPY WITH PROPOFOL N/A 05/07/2016   Procedure: COLONOSCOPY WITH PROPOFOL;  Surgeon: Christena Deem, MD;  Location: [facility] ENDOSCOPY;  Service: Endoscopy;  Laterality: N/A;  . ESOPHAGOGASTRODUODENOSCOPY    . ESOPHAGOGASTRODUODENOSCOPY (EGD) WITH PROPOFOL N/A 05/07/2016   Procedure: ESOPHAGOGASTRODUODENOSCOPY (EGD) WITH PROPOFOL;  Surgeon: Christena Deem, MD;  Location: [facility] ENDOSCOPY;  Service: Endoscopy;  Laterality: N/A;  . EYE SURGERY    . TONSILLECTOMY      Medical History: Past Medical History:  Diagnosis Date  . Anemia    iron deficiency  . Asthma   . Barrett's esophagus   . Depression   . Diabetes mellitus without complication (HCC)   . Dysphagia   . GERD (gastroesophageal reflux disease)   . Gout   . H/O chronic pancreatitis   . Hyperlipidemia   . Hypertension   . Hypothyroidism   . Hypouricemia   . Insomnia   . Neuromuscular disorder (HCC)   . Paranoid schizophrenia (HCC)   . Paranoid schizophrenia (HCC)   . Psychogenic polydipsia   . Rosacea   . Schizophrenia (HCC)   . Shortness of breath dyspnea   . Sleep apnea   . Splenic vein thrombosis   . Varices, esophageal (HCC)   . Vitamin D deficiency     Family History: Family History  Problem Relation Age of Onset  . Diabetes Maternal Grandfather     Social History: Social History   Socioeconomic History  . Marital status: Single    Spouse name: Not on file  . Number of children: Not on file  . Years of education: Not on file  . Highest education level: Not on file  Occupational History  . Not on file  Social Needs  . Financial resource strain: Not on file  . Food  insecurity:    Worry: Not on file    Inability: Not on file  . Transportation needs:    Medical: Not on file    Non-medical: Not on file  Tobacco Use  . Smoking status: Never Smoker  . Smokeless tobacco: Never Used  Substance and Sexual Activity  . Alcohol use: No  . Drug use: No  . Sexual activity: Not on file  Lifestyle  . Physical activity:    Days per week: Not on file    Minutes per session: Not on file  . Stress: Not on file  Relationships  . Social connections:    Talks on phone: Not on file    Gets together: Not on file    Attends religious service: Not on file    Active member of club or organization: Not on file    Attends meetings of  clubs or organizations: Not on file    Relationship status: Not on file  . Intimate partner violence:    Fear of current or ex partner: Not on file    Emotionally abused: Not on file    Physically abused: Not on file    Forced sexual activity: Not on file  Other Topics Concern  . Not on file  Social History Narrative  . Not on file    Vital Signs: Blood pressure 116/62, pulse 89, resp. rate 16, height 6\' 1"  (1.854 m), weight 179 lb (81.2 kg), SpO2 96 %.  Examination: General Appearance: The patient is well-developed, well-nourished, and in no distress. Skin: Gross inspection of skin unremarkable. Head: normocephalic, no gross deformities. Eyes: no gross deformities noted. ENT: ears appear grossly normal no exudates. Neck: Supple. No thyromegaly. No LAD. Respiratory: clear bilaterally. Cardiovascular: Normal S1 and S2 without murmur or rub. Extremities: No cyanosis. pulses are equal. Neurologic: Alert and oriented. No involuntary movements.  LABS: No results found for this or any previous visit (from the past 2160 hour(s)).  Radiology: No results found.  No results found.  No results found.    Assessment and Plan: There are no active problems to display for this patient. 1. Hypersomnia Pt reports his  hypersomnia has improved.  Care worker reports he does not nap during the day and overall is acting like himself.   2. Shortness of breath Rarely sob, when eh has an episode it resolves quickly.  Continue to watch for new signs and symptoms.  Follow up in 6 months in office.     General Counseling: I have discussed the findings of the evaluation and examination with Merlyn AlbertFred.  I have also discussed any further diagnostic evaluation thatmay be needed or ordered today. Huy verbalizes understanding of the findings of todays visit. We also reviewed his medications today and discussed drug interactions and side effects including but not limited excessive drowsiness and altered mental states. We also discussed that there is always a risk not just to him but also people around him. he has been encouraged to call the office with any questions or concerns that should arise related to todays visit.    Time spent: 25 This patient was seen by Blima LedgerAdam Oluchi Pucci AGNP-C in Collaboration with Dr. Freda MunroSaadat Khan as a part of collaborative care agreement.   I have personally obtained a history, examined the patient, evaluated laboratory and imaging results, formulated the assessment and plan and placed orders.    Yevonne PaxSaadat A Khan, MD [facility] Pulmonary and Critical Care Sleep medicine

## 2019-01-01 ENCOUNTER — Ambulatory Visit: Payer: Medicare Other | Admitting: Internal Medicine

## 2019-01-04 ENCOUNTER — Encounter: Payer: Self-pay | Admitting: Internal Medicine

## 2019-01-04 DIAGNOSIS — G4733 Obstructive sleep apnea (adult) (pediatric): Secondary | ICD-10-CM | POA: Insufficient documentation

## 2019-01-08 ENCOUNTER — Other Ambulatory Visit: Payer: Self-pay

## 2019-01-08 ENCOUNTER — Ambulatory Visit: Payer: Medicare Other | Admitting: Internal Medicine

## 2019-01-08 ENCOUNTER — Encounter: Payer: Self-pay | Admitting: Internal Medicine

## 2019-01-08 VITALS — BP 133/81 | HR 69 | Resp 16 | Ht 73.0 in | Wt 179.4 lb

## 2019-01-08 DIAGNOSIS — R0602 Shortness of breath: Secondary | ICD-10-CM

## 2019-01-08 DIAGNOSIS — G4710 Hypersomnia, unspecified: Secondary | ICD-10-CM

## 2019-01-08 NOTE — Progress Notes (Signed)
[facility] [address] [place], [place] [address]  Pulmonary Sleep Medicine   Office Visit Note  Patient Name: Dalton Thomas. DOB: July 20, 1955 MRN [id number]  Date of Service: 01/08/2019  Complaints/HPI: Pt is here for follow up. Overall he is doing very well.  He denies any respiratory difficulty.  He does not use any inhalers, and denies any DOE.  He is able to do most of what he wants to do.    ROS  General: (-) fever, (-) chills, (-) night sweats, (-) weakness Skin: (-) rashes, (-) itching,. Eyes: (-) visual changes, (-) redness, (-) itching. Nose and Sinuses: (-) nasal stuffiness or itchiness, (-) postnasal drip, (-) nosebleeds, (-) sinus trouble. Mouth and Throat: (-) sore throat, (-) hoarseness. Neck: (-) swollen glands, (-) enlarged thyroid, (-) neck pain. Respiratory: - cough, (-) bloody sputum, - shortness of breath, - wheezing. Cardiovascular: - ankle swelling, (-) chest pain. Lymphatic: (-) lymph node enlargement. Neurologic: (-) numbness, (-) tingling. Psychiatric: (-) anxiety, (-) depression   Current Medication: Outpatient Encounter Medications as of 01/08/2019  Medication Sig  . acetaminophen (TYLENOL) 325 MG tablet Take 650 mg by mouth every 6 (six) hours as needed.  Marland Kitchen aspirin EC 81 MG tablet Take 81 mg by mouth daily.  . cetirizine (ZYRTEC) 10 MG tablet Take 10 mg by mouth daily.  . cloZAPine (CLOZARIL) 100 MG tablet Take 100 mg by mouth at bedtime.  . Iloperidone (FANAPT) 8 MG TABS Take by mouth 2 (two) times daily.  . Insulin Glargine (TOUJEO SOLOSTAR) 300 UNIT/ML SOPN Inject 28 Units into the skin daily.  . insulin lispro protamine-lispro (HUMALOG 50/50 MIX) (50-50) 100 UNIT/ML SUSP injection Inject 2 Units into the skin 3 (three) times daily.  Marland Kitchen levothyroxine (SYNTHROID, LEVOTHROID) 75 MCG tablet Take 75 mcg by mouth daily before breakfast.  . loperamide (IMODIUM) 2 MG capsule Take 2 mg by mouth as needed for diarrhea or loose stools.   Marland Kitchen losartan (COZAAR) 100 MG tablet Take 100 mg by mouth daily.  . Melatonin 5 MG CAPS Take by mouth.  . metFORMIN (GLUCOPHAGE) 1000 MG tablet Take 1,000 mg by mouth daily with breakfast. Two tablets by mouth each morning  . metoprolol (LOPRESSOR) 50 MG tablet Take 50 mg by mouth daily.   . metroNIDAZOLE (METROGEL) 0.75 % gel Apply 1 application topically 2 (two) times daily.  . Multiple Vitamin (THEREMS PO) Take by mouth daily at 2 PM.  . oral electrolytes (THERMOTABS) TABS tablet Take 1 tablet by mouth 2 (two) times daily.   . OXYGEN Inhale into the lungs. If needed o2 drops below 90%  . Pancrelipase, Lip-Prot-Amyl, (CREON PO) Take 2 capsules by mouth QID.  Marland Kitchen pantoprazole (PROTONIX) 40 MG tablet Take 40 mg by mouth daily.  . polyethylene glycol (MIRALAX / GLYCOLAX) packet Take 17 g by mouth 2 (two) times daily.  . simethicone (GAS-X) 80 MG chewable tablet Chew 80 mg by mouth every 6 (six) hours as needed for flatulence.  . simethicone (MYLICON) 125 MG chewable tablet Chew 125 mg by mouth every 6 (six) hours as needed for flatulence.  . simvastatin (ZOCOR) 20 MG tablet Take 20 mg by mouth daily.  . sitaGLIPtin (JANUVIA) 100 MG tablet Take 100 mg by mouth daily.  . traZODone (DESYREL) 50 MG tablet Take 50 mg by mouth at bedtime.  . [DISCONTINUED] fluticasone furoate-vilanterol (BREO ELLIPTA) 100-25 MCG/INH AEPB Inhale 1 puff into the lungs daily.  . [DISCONTINUED] lipase/protease/amylase (CREON) 12000 units CPEP capsule  Take 24,000 Units by mouth 2 (two) times daily.  . [DISCONTINUED] losartan (COZAAR) 50 MG tablet Take 50 mg by mouth daily.  . [DISCONTINUED] oral electrolytes (THERMOTABS) TABS tablet Take by mouth.  . [DISCONTINUED] rOPINIRole (REQUIP) 0.25 MG tablet Take 0.25 mg by mouth at bedtime.   No facility-administered encounter medications on file as of 01/08/2019.     Surgical History: Past Surgical History:  Procedure Laterality Date  . CATARACT EXTRACTION W/PHACO Right  09/07/2014   Procedure: CATARACT EXTRACTION PHACO AND INTRAOCULAR LENS PLACEMENT (IOC);  Surgeon: Galen ManilaWilliam Porfilio, MD;  Location: [facility] ORS;  Service: Ophthalmology;  Laterality: Right;  [place] 02:16 AP% 24.8 CDE 33.94  . COLONOSCOPY    . COLONOSCOPY N/A 07/16/2016   Procedure: COLONOSCOPY;  Surgeon: Christena DeemMartin U Skulskie, MD;  [facility];  Service: Endoscopy;  Laterality: N/A;  . COLONOSCOPY WITH PROPOFOL N/A 05/07/2016   Procedure: COLONOSCOPY WITH PROPOFOL;  Surgeon: Christena DeemMartin U Skulskie, MD;  [facility];  Service: Endoscopy;  Laterality: N/A;  . ESOPHAGOGASTRODUODENOSCOPY    . ESOPHAGOGASTRODUODENOSCOPY (EGD) WITH PROPOFOL N/A 05/07/2016   Procedure: ESOPHAGOGASTRODUODENOSCOPY (EGD) WITH PROPOFOL;  Surgeon: Christena DeemMartin U Skulskie, MD;  [facility];  Service: Endoscopy;  Laterality: N/A;  . EYE SURGERY    . TONSILLECTOMY      Medical History: Past Medical History:  Diagnosis Date  . Anemia    iron deficiency  . Asthma   . Barrett's esophagus   . Depression   . Diabetes mellitus without complication (HCC)   . Dysphagia   . GERD (gastroesophageal reflux disease)   . Gout   . H/O chronic pancreatitis   . Hyperlipidemia   . Hypertension   . Hypothyroidism   . Hypouricemia   . Insomnia   . Neuromuscular disorder (HCC)   . Obstructive sleep apnea   . Paranoid schizophrenia (HCC)   . Paranoid schizophrenia (HCC)   . Psychogenic polydipsia   . Rosacea   . Schizophrenia (HCC)   . Shortness of breath dyspnea   . Sleep apnea   . Splenic vein thrombosis   . Varices, esophageal (HCC)   . Vitamin D deficiency     Family History: Family History  Problem Relation Age of Onset  . Diabetes Maternal Grandfather     Social History: Social History   Socioeconomic History  . Marital status: Single    Spouse name: Not on file  . Number of children: Not on file  . Years of education: Not on file  . Highest education level: Not on file  Occupational  History  . Not on file  Social Needs  . Financial resource strain: Not on file  . Food insecurity    Worry: Not on file    Inability: Not on file  . Transportation needs    Medical: Not on file    Non-medical: Not on file  Tobacco Use  . Smoking status: Never Smoker  . Smokeless tobacco: Never Used  Substance and Sexual Activity  . Alcohol use: No  . Drug use: No  . Sexual activity: Not on file  Lifestyle  . Physical activity    Days per week: Not on file    Minutes per session: Not on file  . Stress: Not on file  Relationships  . Social Musicianconnections    Talks on phone: Not on file    Gets together: Not on file    Attends religious service: Not on file    Active member of club or organization:  Not on file    Attends meetings of clubs or organizations: Not on file    Relationship status: Not on file  . Intimate partner violence    Fear of current or ex partner: Not on file    Emotionally abused: Not on file    Physically abused: Not on file    Forced sexual activity: Not on file  Other Topics Concern  . Not on file  Social History Narrative  . Not on file    Vital Signs: Resp. rate 16, height 6\' 1"  (1.854 m), weight 179 lb 6.4 oz (81.4 kg).  Examination: General Appearance: The patient is well-developed, well-nourished, and in no distress. Skin: Gross inspection of skin unremarkable. Head: normocephalic, no gross deformities. Eyes: no gross deformities noted. ENT: ears appear grossly normal no exudates. Neck: Supple. No thyromegaly. No LAD. Respiratory: clear bilaterally. Cardiovascular: Normal S1 and S2 without murmur or rub. Extremities: No cyanosis. pulses are equal. Neurologic: Alert and oriented. No involuntary movements.  LABS: No results found for this or any previous visit (from the past 2160 hour(s)).  Radiology: No results found.  No results found.  No results found.    Assessment and Plan: Patient Active Problem List   Diagnosis Date Noted   . Obstructive sleep apnea     1. Hypersomnia Resolved, Denies any Hypersomnia at this time. Will follow up with patient in 1 year.   2. Shortness of breath Denies current sob, will follow up with patient in 1 year, or sooner if symptoms return.   General Counseling: I have discussed the findings of the evaluation and examination with Dalton Thomas.  I have also discussed any further diagnostic evaluation thatmay be needed or ordered today. Dalton Thomas verbalizes understanding of the findings of todays visit. We also reviewed his medications today and discussed drug interactions and side effects including but not limited excessive drowsiness and altered mental states. We also discussed that there is always a risk not just to him but also people around him. he has been encouraged to call the office with any questions or concerns that should arise related to todays visit.    Time spent: 15 This patient was seen by Orson Gear AGNP-C in Collaboration with Dr. Devona Konig as a part of collaborative care agreement.   I have personally obtained a history, examined the patient, evaluated laboratory and imaging results, formulated the assessment and plan and placed orders.    Allyne Gee, MD [facility] Pulmonary and Critical Care Sleep medicine

## 2020-01-11 ENCOUNTER — Ambulatory Visit: Payer: Medicare Other | Admitting: Internal Medicine

## 2020-02-02 ENCOUNTER — Ambulatory Visit: Payer: Medicare Other | Admitting: Internal Medicine

## 2020-02-02 ENCOUNTER — Encounter: Payer: Self-pay | Admitting: Internal Medicine

## 2020-02-02 ENCOUNTER — Other Ambulatory Visit: Payer: Self-pay

## 2020-02-02 DIAGNOSIS — G4710 Hypersomnia, unspecified: Secondary | ICD-10-CM | POA: Diagnosis not present

## 2020-02-02 DIAGNOSIS — R0602 Shortness of breath: Secondary | ICD-10-CM | POA: Diagnosis not present

## 2020-02-02 DIAGNOSIS — K219 Gastro-esophageal reflux disease without esophagitis: Secondary | ICD-10-CM

## 2020-02-02 NOTE — Progress Notes (Signed)
[facility] [address] [place], [place] [address]  Pulmonary Sleep Medicine   Office Visit Note  Patient Name: Dalton Thomas. DOB: Jul 15, 1955 MRN [id number]  Date of Service: 02/08/2020  Complaints/HPI: Patient is here for routine pulmonary follow-up Followed for shortness of breath and hypersomnia Sleep study 2020--AHI 1.8, did not demonstrate OSA Hypersomnia well controlled--will take short naps during the day but majority of the time feels rested, sleeps well at night, care provider does not report any sleep disturbances or loud snoring He does complain of burning sensation in the back of his throat when he lies down at night--keeps him up some nights, reports he sneezes frequently after eating and has quite a bit of belching--hx of Barrett's esophagus, has not been seen by GI in quite some time  ROS  General: (-) fever, (-) chills, (-) night sweats, (-) weakness Skin: (-) rashes, (-) itching,. Eyes: (-) visual changes, (-) redness, (-) itching. Nose and Sinuses: (-) nasal stuffiness or itchiness, (-) postnasal drip, (-) nosebleeds, (-) sinus trouble. Mouth and Throat: (-) sore throat, (-) hoarseness. Neck: (-) swollen glands, (-) enlarged thyroid, (-) neck pain. Respiratory: - cough, (-) bloody sputum, - shortness of breath, - wheezing. Cardiovascular: - ankle swelling, (-) chest pain. Lymphatic: (-) lymph node enlargement. Neurologic: (-) numbness, (-) tingling. Psychiatric: (-) anxiety, (-) depression   Current Medication: Outpatient Encounter Medications as of 02/02/2020  Medication Sig  . acetaminophen (TYLENOL) 325 MG tablet Take 650 mg by mouth every 4 (four) hours as needed.   Marland Kitchen alum & mag hydroxide-simeth (GERI-LANTA) 200-200-20 MG/5ML suspension Take by mouth every 6 (six) hours as needed for indigestion or heartburn.  Marland Kitchen aspirin EC 81 MG tablet Take 81 mg by mouth daily.  Marland Kitchen bismuth subsalicylate (PEPTO BISMOL) 262 MG/15ML suspension Take 30 mLs  by mouth every 6 (six) hours as needed.  . cetirizine (ZYRTEC) 10 MG tablet Take 10 mg by mouth daily.  . Clozapine 150 MG TBDP Take by mouth daily.  Marland Kitchen guaiFENesin (ROBITUSSIN) 100 MG/5ML liquid Take 200 mg by mouth every 6 (six) hours as needed for cough.  . Iloperidone (FANAPT) 8 MG TABS Take by mouth 2 (two) times daily.  . Insulin Glargine (TOUJEO SOLOSTAR) 300 UNIT/ML SOPN Inject 33 Units into the skin daily after breakfast.   . insulin lispro protamine-lispro (HUMALOG 50/50 MIX) (50-50) 100 UNIT/ML SUSP injection Inject 2 Units into the skin. Use per sliding scale  . levothyroxine (SYNTHROID, LEVOTHROID) 75 MCG tablet Take 75 mcg by mouth daily before breakfast.  . loperamide (IMODIUM) 2 MG capsule Take 2 mg by mouth as needed for diarrhea or loose stools.  Marland Kitchen losartan (COZAAR) 100 MG tablet Take 100 mg by mouth daily.  Marland Kitchen losartan (COZAAR) 50 MG tablet Take 50 mg by mouth daily.  . Magnesium Hydroxide (MILK OF MAGNESIA PO) Take by mouth. Take 2 tbsp as needed for 1 dose  . Melatonin 5 MG CAPS Take by mouth.  . metFORMIN (GLUCOPHAGE) 1000 MG tablet Take 1,000 mg by mouth daily with breakfast. Two tablets by mouth each morning  . metoprolol (LOPRESSOR) 50 MG tablet Take 50 mg by mouth daily.   . metroNIDAZOLE (METROGEL) 0.75 % gel Apply 1 application topically daily.   . Multiple Vitamin (THEREMS PO) Take by mouth daily at 2 PM.  . oral electrolytes (THERMOTABS) TABS tablet Take 1 tablet by mouth 2 (two) times daily.   . OXYGEN Inhale into the lungs. If needed o2 drops below 90%   .  Pancrelipase, Lip-Prot-Amyl, (CREON PO) Take 2 capsules by mouth QID.  Marland Kitchen pantoprazole (PROTONIX) 40 MG tablet Take 40 mg by mouth daily.  . polyethylene glycol (MIRALAX / GLYCOLAX) packet Take 17 g by mouth 2 (two) times daily.  . simethicone (GAS-X) 80 MG chewable tablet Chew 80 mg by mouth every 6 (six) hours as needed for flatulence.  . simethicone (MYLICON) 125 MG chewable tablet Chew 125 mg by mouth every 6  (six) hours as needed for flatulence.  . simvastatin (ZOCOR) 20 MG tablet Take 20 mg by mouth daily.  . sitaGLIPtin (JANUVIA) 100 MG tablet Take 100 mg by mouth daily.  . traZODone (DESYREL) 50 MG tablet Take 50 mg by mouth at bedtime.  . [DISCONTINUED] cloZAPine (CLOZARIL) 100 MG tablet Take 100 mg by mouth at bedtime. (Patient not taking: Reported on 02/02/2020)   No facility-administered encounter medications on file as of 02/02/2020.    Surgical History: Past Surgical History:  Procedure Laterality Date  . CATARACT EXTRACTION W/PHACO Right 09/07/2014   Procedure: CATARACT EXTRACTION PHACO AND INTRAOCULAR LENS PLACEMENT (IOC);  Surgeon: Galen Manila, MD;  Location: [facility] ORS;  Service: Ophthalmology;  Laterality: Right;  [place] 02:16 AP% 24.8 CDE 33.94  . COLONOSCOPY    . COLONOSCOPY N/A 07/16/2016   Procedure: COLONOSCOPY;  Surgeon: Christena Deem, MD;  Location: [facility] ENDOSCOPY;  Service: Endoscopy;  Laterality: N/A;  . COLONOSCOPY WITH PROPOFOL N/A 05/07/2016   Procedure: COLONOSCOPY WITH PROPOFOL;  Surgeon: Christena Deem, MD;  Location: [facility] ENDOSCOPY;  Service: Endoscopy;  Laterality: N/A;  . ESOPHAGOGASTRODUODENOSCOPY    . ESOPHAGOGASTRODUODENOSCOPY (EGD) WITH PROPOFOL N/A 05/07/2016   Procedure: ESOPHAGOGASTRODUODENOSCOPY (EGD) WITH PROPOFOL;  Surgeon: Christena Deem, MD;  Location: [facility] ENDOSCOPY;  Service: Endoscopy;  Laterality: N/A;  . EYE SURGERY    . TONSILLECTOMY      Medical History: Past Medical History:  Diagnosis Date  . Anemia    iron deficiency  . Asthma   . Barrett's esophagus   . Depression   . Diabetes mellitus without complication (HCC)   . Dysphagia   . GERD (gastroesophageal reflux disease)   . Gout   . H/O chronic pancreatitis   . Hyperlipidemia   . Hypertension   . Hypothyroidism   . Hypouricemia   . Insomnia   . Neuromuscular disorder (HCC)   . Obstructive sleep apnea   . Paranoid schizophrenia (HCC)   . Paranoid schizophrenia (HCC)    . Psychogenic polydipsia   . Rosacea   . Schizophrenia (HCC)   . Shortness of breath dyspnea   . Sleep apnea   . Splenic vein thrombosis   . Varices, esophageal (HCC)   . Vitamin D deficiency     Family History: Family History  Problem Relation Age of Onset  . Diabetes Maternal Grandfather     Social History: Social History   Socioeconomic History  . Marital status: Single    Spouse name: Not on file  . Number of children: Not on file  . Years of education: Not on file  . Highest education level: Not on file  Occupational History  . Not on file  Tobacco Use  . Smoking status: Never Smoker  . Smokeless tobacco: Never Used  Vaping Use  . Vaping Use: Never used  Substance and Sexual Activity  . Alcohol use: No  . Drug use: No  . Sexual activity: Not on file  Other Topics Concern  . Not on file  Social History Narrative  . Not  on file   Social Determinants of Health   Financial Resource Strain:   . Difficulty of Paying Living Expenses: Not on file  Food Insecurity:   . Worried About Programme researcher, broadcasting/film/video in the Last Year: Not on file  . Ran Out of Food in the Last Year: Not on file  Transportation Needs:   . Lack of Transportation (Medical): Not on file  . Lack of Transportation (Non-Medical): Not on file  Physical Activity:   . Days of Exercise per Week: Not on file  . Minutes of Exercise per Session: Not on file  Stress:   . Feeling of Stress : Not on file  Social Connections:   . Frequency of Communication with Friends and Family: Not on file  . Frequency of Social Gatherings with Friends and Family: Not on file  . Attends Religious Services: Not on file  . Active Member of Clubs or Organizations: Not on file  . Attends Banker Meetings: Not on file  . Marital Status: Not on file  Intimate Partner Violence:   . Fear of Current or Ex-Partner: Not on file  . Emotionally Abused: Not on file  . Physically Abused: Not on file  . Sexually  Abused: Not on file    Vital Signs: Blood pressure 110/71, pulse 75, temperature 97.8 F (36.6 C), resp. rate 16, height 6' (1.829 m), weight 173 lb (78.5 kg), SpO2 98 %.  Examination: General Appearance: The patient is well-developed, well-nourished, and in no distress. Skin: Gross inspection of skin unremarkable. Head: normocephalic, no gross deformities. Eyes: no gross deformities noted. ENT: ears appear grossly normal no exudates. Neck: Supple. No thyromegaly. No LAD. Respiratory: Clear throughout. Cardiovascular: Normal S1 and S2 without murmur or rub. Extremities: No cyanosis. pulses are equal. Neurologic: Alert and oriented. No involuntary movements.  LABS: No results found for this or any previous visit (from the past 2160 hour(s)).  Radiology: No results found.   Assessment and Plan: Patient Active Problem List   Diagnosis Date Noted  . Obstructive sleep apnea     1. Gastroesophageal reflux disease without esophagitis Referral to GI for GERD symptoms with his history of Barrett's esophagus--may need endoscopy - Ambulatory referral to Gastroenterology  2. Hypersomnia Much improved at this time--continue to monitor  3. Shortness of breath Resolved at this time, continue to monitor  General Counseling: I have discussed the findings of the evaluation and examination with Merlyn Albert.  I have also discussed any further diagnostic evaluation thatmay be needed or ordered today. Lundy verbalizes understanding of the findings of todays visit. We also reviewed his medications today and discussed drug interactions and side effects including but not limited excessive drowsiness and altered mental states. We also discussed that there is always a risk not just to him but also people around him. he has been encouraged to call the office with any questions or concerns that should arise related to todays visit.  Orders Placed This Encounter  Procedures  . Ambulatory referral to  Gastroenterology    Referral Priority:   Routine    Referral Type:   Consultation    Referral Reason:   Specialty Services Required    Number of Visits Requested:   1     Time spent: 30  I have personally obtained a history, examined the patient, evaluated laboratory and imaging results, formulated the assessment and plan and placed orders. This patient was seen by Brent General AGNP-C in Collaboration with Dr. Freda Munro as a  part of collaborative care agreement.    Yevonne PaxSaadat A Khan, MD [facility] Pulmonary and Critical Care Sleep medicine

## 2020-02-08 ENCOUNTER — Encounter: Payer: Self-pay | Admitting: Internal Medicine

## 2020-02-08 NOTE — Patient Instructions (Signed)
Barrett's Esophagus ° °Barrett's esophagus occurs when the tissue that lines the esophagus changes or becomes damaged. The esophagus is the tube that carries food from the throat to the stomach. With Barrett's esophagus, the cells that line the esophagus are replaced by cells that are similar to the lining of the intestines (intestinal metaplasia). °Barrett's esophagus itself may not cause any symptoms. However, many people who have Barrett's esophagus also have gastroesophageal reflux disease (GERD), which may cause symptoms such as heartburn. Over time, a few people with this condition may develop cancer of the esophagus. Treatment may include medicines, procedures to destroy the abnormal cells, or surgery. °What are the causes? °The exact cause of this condition is not known. In some cases, the condition develops from damage to the lining of the esophagus caused by GERD. GERD occurs when stomach acids flow up from the stomach into the esophagus. Frequent symptoms of GERD may cause intestinal metaplasia or cause cell changes (dysplasia). °What increases the risk? °You are more likely to develop this condition if you: °· Have GERD. °· Are male. °· Are Caucasian. °· Are obese. °· Are older than 50. °· Have a hiatal hernia. This is a condition in which part of your stomach bulges into your chest. °· Smoke. °What are the signs or symptoms? °People with Barrett's esophagus often have no symptoms. However, many people with this condition also have GERD. Symptoms of GERD may include: °· Heartburn. °· Difficulty swallowing. °· Dry cough. °How is this diagnosed? °This condition may be diagnosed based on: °· Results of an upper gastrointestinal endoscopy. For this exam, a thin, flexible tube with a light and a camera on the end (endoscope) is passed down your esophagus. Your health care provider can view the inside of your esophagus during this procedure. °· Results of a biopsy. For this procedure, several tissue samples  are removed (biopsy) from your esophagus. They are then checked for intestinal metaplasia or dysplasia. °How is this treated? °Treatment for this condition may include: °· Medicines (proton pump inhibitors, or PPIs) to decrease or stop GERD. °· Periodic endoscopic exams to make sure that cancer is not developing. °· A procedure or surgery for dysplasia. This may include: °? Removal or destruction of abnormal cells. °? Removal of part of the esophagus (esophagectomy). °Follow these instructions at home: °Eating and drinking °· Eat more fruits and vegetables. °· Avoid fatty foods. °· Eat small, frequent meals instead of large meals. °· Avoid foods that cause heartburn. These foods include: °? Coffee and alcoholic drinks. °? Tomatoes and foods made with tomatoes. °? Greasy or spicy foods. °? Chocolate and peppermint. °· Do not drink alcohol. °General instructions °· Take over-the-counter and prescription medicines only as told by your health care provider. °· Do not use any products that contain nicotine or tobacco, such as cigarettes and e-cigarettes. If you need help quitting, ask your health care provider. °· If you are being treated for GERD, make sure you take medicines and follow all instructions as told by your health care provider. °· Keep all follow-up visits as told by your health care provider. This is important. °Contact a health care provider if: °· You have heartburn or GERD symptoms. °· You have difficulty swallowing. °Get help right away if: °· You have chest pain. °· You are unable to swallow. °· You vomit blood or material that looks like coffee grounds. °· Your stool (feces) is bright red or dark. °Summary °· Barrett's esophagus occurs when the tissue that lines   the esophagus changes or becomes damaged. °· Barrett's esophagus may be diagnosed with an upper gastrointestinal endoscopy and a biopsy. °· Treatment may include medicines, procedures to remove abnormal cells, or surgery. °· Follow your  health care provider's instructions about what to eat and drink, what medicines to take, and when to call for help. °This information is not intended to replace advice given to you by your health care provider. Make sure you discuss any questions you have with your health care provider. °Document Revised: 07/22/2017 Document Reviewed: 07/22/2017 °Elsevier Patient Education © 2020 [employer] ° °

## 2020-03-21 ENCOUNTER — Ambulatory Visit: Payer: Medicare Other | Attending: Internal Medicine

## 2020-03-21 DIAGNOSIS — Z23 Encounter for immunization: Secondary | ICD-10-CM

## 2020-03-21 NOTE — Progress Notes (Signed)
   [id number]  Name:  Kallum Jorgensen.    MRN: [id number] DOB: 1955/10/25  03/21/2020  Mr. Redner was observed post Covid-19 immunization for 15 minutes without incident. He was provided with Vaccine Information Sheet and instruction to access the V-Safe system.   Mr. Handlin was instructed to call 911 with any severe reactions post vaccine: Marland Kitchen Difficulty breathing  . Swelling of face and throat  . A fast heartbeat  . A bad rash all over body  . Dizziness and weakness   Immunizations Administered    No immunizations on file.

## 2020-09-07 ENCOUNTER — Encounter: Payer: Self-pay | Admitting: Ophthalmology

## 2020-09-13 NOTE — Discharge Instructions (Signed)

## 2020-09-14 ENCOUNTER — Ambulatory Visit: Payer: Medicare Other | Admitting: Anesthesiology

## 2020-09-14 ENCOUNTER — Other Ambulatory Visit: Payer: Self-pay

## 2020-09-14 ENCOUNTER — Encounter
Admission: RE | Disposition: A | Discharge status: Home / Self Care | Payer: Self-pay | Source: Home / Self Care | Attending: Ophthalmology

## 2020-09-14 ENCOUNTER — Ambulatory Visit
Admission: RE | Admit: 2020-09-14 | Discharge: 2020-09-14 | Disposition: A | Discharge status: Home / Self Care | Payer: Medicare Other | Attending: Ophthalmology | Admitting: Ophthalmology

## 2020-09-14 ENCOUNTER — Encounter: Payer: Self-pay | Admitting: Ophthalmology

## 2020-09-14 DIAGNOSIS — Z9841 Cataract extraction status, right eye: Secondary | ICD-10-CM | POA: Diagnosis not present

## 2020-09-14 DIAGNOSIS — Z888 Allergy status to other drugs, medicaments and biological substances status: Secondary | ICD-10-CM | POA: Insufficient documentation

## 2020-09-14 DIAGNOSIS — Z7982 Long term (current) use of aspirin: Secondary | ICD-10-CM | POA: Diagnosis not present

## 2020-09-14 DIAGNOSIS — Z794 Long term (current) use of insulin: Secondary | ICD-10-CM | POA: Insufficient documentation

## 2020-09-14 DIAGNOSIS — Z961 Presence of intraocular lens: Secondary | ICD-10-CM | POA: Diagnosis not present

## 2020-09-14 DIAGNOSIS — E1136 Type 2 diabetes mellitus with diabetic cataract: Secondary | ICD-10-CM | POA: Insufficient documentation

## 2020-09-14 DIAGNOSIS — Z79899 Other long term (current) drug therapy: Secondary | ICD-10-CM | POA: Insufficient documentation

## 2020-09-14 DIAGNOSIS — Z7984 Long term (current) use of oral hypoglycemic drugs: Secondary | ICD-10-CM | POA: Insufficient documentation

## 2020-09-14 DIAGNOSIS — Z79890 Hormone replacement therapy (postmenopausal): Secondary | ICD-10-CM | POA: Insufficient documentation

## 2020-09-14 DIAGNOSIS — H5703 Miosis: Secondary | ICD-10-CM | POA: Diagnosis not present

## 2020-09-14 DIAGNOSIS — H2512 Age-related nuclear cataract, left eye: Secondary | ICD-10-CM | POA: Diagnosis not present

## 2020-09-14 HISTORY — PX: CATARACT EXTRACTION W/PHACO: SHX586

## 2020-09-14 LAB — GLUCOSE, CAPILLARY
Glucose-Capillary: 99 mg/dL (ref 70–99)
Glucose-Capillary: 98 mg/dL (ref 70–99)

## 2020-09-14 SURGERY — PHACOEMULSIFICATION, CATARACT, WITH IOL INSERTION
Anesthesia: Monitor Anesthesia Care | Site: Eye | Laterality: Left

## 2020-09-14 MED ORDER — MIDAZOLAM HCL 2 MG/2ML IJ SOLN
INTRAMUSCULAR | Status: DC | PRN
  Administered 2020-09-14: 1 mg via INTRAVENOUS

## 2020-09-14 MED ORDER — OXYCODONE HCL 5 MG/5ML PO SOLN: 5.0000 mg | Freq: Once | ORAL | Status: DC | PRN

## 2020-09-14 MED ORDER — OXYCODONE HCL 5 MG PO TABS: 5.0000 mg | ORAL_TABLET | Freq: Once | ORAL | Status: DC | PRN

## 2020-09-14 MED ORDER — NA HYALUR & NA CHOND-NA HYALUR 0.4-0.35 ML IO KIT
PACK | INTRAOCULAR | Status: DC | PRN
  Administered 2020-09-14: 1 mL via INTRAOCULAR

## 2020-09-14 MED ORDER — EPINEPHRINE PF 1 MG/ML IJ SOLN
INTRAOCULAR | Status: DC | PRN
  Administered 2020-09-14: 78 mL via OPHTHALMIC

## 2020-09-14 MED ORDER — ARMC OPHTHALMIC DILATING DROPS
1.0000 "application " | OPHTHALMIC | Status: DC | PRN
  Administered 2020-09-14 (×3): 1 via OPHTHALMIC

## 2020-09-14 MED ORDER — LIDOCAINE HCL (PF) 2 % IJ SOLN
INTRAOCULAR | Status: DC | PRN
  Administered 2020-09-14: 1 mL

## 2020-09-14 MED ORDER — BRIMONIDINE TARTRATE-TIMOLOL 0.2-0.5 % OP SOLN
OPHTHALMIC | Status: DC | PRN
  Administered 2020-09-14: 1 [drp] via OPHTHALMIC

## 2020-09-14 MED ORDER — TRYPAN BLUE 0.06 % OP SOLN
OPHTHALMIC | Status: DC | PRN
  Administered 2020-09-14: 0.5 mL via INTRAOCULAR

## 2020-09-14 MED ORDER — LACTATED RINGERS IV SOLN: INTRAVENOUS | Status: DC

## 2020-09-14 MED ORDER — FENTANYL CITRATE (PF) 100 MCG/2ML IJ SOLN
INTRAMUSCULAR | Status: DC | PRN
  Administered 2020-09-14: 50 ug via INTRAVENOUS

## 2020-09-14 MED ORDER — CEFUROXIME OPHTHALMIC INJECTION 1 MG/0.1 ML
INJECTION | OPHTHALMIC | Status: DC | PRN
  Administered 2020-09-14: 0.1 mL via INTRACAMERAL

## 2020-09-14 MED ORDER — TETRACAINE HCL 0.5 % OP SOLN
1.0000 [drp] | OPHTHALMIC | Status: DC | PRN
  Administered 2020-09-14 (×3): 1 [drp] via OPHTHALMIC

## 2020-09-14 SURGICAL SUPPLY — 25 items
CANNULA ANT/CHMB 27GA (MISCELLANEOUS) ×2 IMPLANT
GLOVE SURG ENC TEXT LTX SZ7.5 (GLOVE) ×2 IMPLANT
GLOVE SURG TRIUMPH 8.0 PF LTX (GLOVE) ×4 IMPLANT
GOWN STRL REUS W/ TWL LRG LVL3 (GOWN DISPOSABLE) ×2 IMPLANT
GOWN STRL REUS W/TWL LRG LVL3 (GOWN DISPOSABLE) ×4
LENS IOL DIOP 19.0 (Intraocular Lens) ×2 IMPLANT
LENS IOL TECNIS MONO 19.0 (Intraocular Lens) ×1 IMPLANT
MARKER SKIN DUAL TIP RULER LAB (MISCELLANEOUS) ×2 IMPLANT
NDL RETROBULBAR .5 NSTRL (NEEDLE) IMPLANT
NEEDLE CAPSULORHEX 25GA (NEEDLE) ×2 IMPLANT
NEEDLE FILTER BLUNT 18X 1/2SAF (NEEDLE) ×2
NEEDLE FILTER BLUNT 18X1 1/2 (NEEDLE) ×2 IMPLANT
PACK CATARACT BRASINGTON (MISCELLANEOUS) ×2 IMPLANT
PACK EYE AFTER SURG (MISCELLANEOUS) ×2 IMPLANT
PACK OPTHALMIC (MISCELLANEOUS) ×2 IMPLANT
RING MALYGIN 7.0 (MISCELLANEOUS) ×2 IMPLANT
SOLUTION OPHTHALMIC SALT (MISCELLANEOUS) ×2 IMPLANT
SUT ETHILON 10-0 CS-B-6CS-B-6 (SUTURE)
SUT VICRYL  9 0 (SUTURE)
SUT VICRYL 9 0 (SUTURE) IMPLANT
SUTURE EHLN 10-0 CS-B-6CS-B-6 (SUTURE) IMPLANT
SYR 3ML LL SCALE MARK (SYRINGE) ×4 IMPLANT
SYR TB 1ML LUER SLIP (SYRINGE) ×2 IMPLANT
WATER STERILE IRR 250ML POUR (IV SOLUTION) ×2 IMPLANT
WIPE NON LINTING 3.25X3.25 (MISCELLANEOUS) ×2 IMPLANT

## 2020-09-14 NOTE — Transfer of Care (Signed)
Immediate Anesthesia Transfer of Care Note  Patient: Dalton Thomas.  Procedure(s) Performed: CATARACT EXTRACTION PHACO AND INTRAOCULAR LENS PLACEMENT (IOC) LEFT DIABETIC [name] (Left Eye)  Patient Location: PACU  Anesthesia Type: MAC  Level of Consciousness: awake, alert  and patient cooperative  Airway and Oxygen Therapy: Patient Spontanous Breathing and Patient connected to supplemental oxygen  Post-op Assessment: Post-op Vital signs reviewed, Patient's Cardiovascular Status Stable, Respiratory Function Stable, Patent Airway and No signs of Nausea or vomiting  Post-op Vital Signs: Reviewed and stable  Complications: No complications documented.

## 2020-09-14 NOTE — Anesthesia Preprocedure Evaluation (Signed)
Anesthesia Evaluation  Patient identified by MRN, date of birth, ID band Patient awake    Reviewed: NPO status   History of Anesthesia Complications Negative for: history of anesthetic complications  Airway Mallampati: II  TM Distance: >3 FB Neck ROM: full    Dental no notable dental hx.    Pulmonary sleep apnea (no cpap) ,    Pulmonary exam normal        Cardiovascular Exercise Tolerance: Good hypertension, Normal cardiovascular exam     Neuro/Psych PSYCHIATRIC DISORDERS Depression Schizophrenia negative neurological ROS     GI/Hepatic Neg liver ROS, GERD  Controlled,  Endo/Other  diabetesHypothyroidism   Renal/GU negative Renal ROS  negative genitourinary   Musculoskeletal   Abdominal   Peds  Hematology negative hematology ROS (+)   Anesthesia Other Findings Pt's mom and caretaker at bedside. Hx from mom.  Reproductive/Obstetrics                             Anesthesia Physical Anesthesia Plan  ASA: III  Anesthesia Plan: MAC   Post-op Pain Management:    Induction:   PONV Risk Score and Plan: 1 and TIVA  Airway Management Planned:   Additional Equipment:   Intra-op Plan:   Post-operative Plan:   Informed Consent: I have reviewed the patients History and Physical, chart, labs and discussed the procedure including the risks, benefits and alternatives for the proposed anesthesia with the patient or authorized representative who has indicated his/her understanding and acceptance.       Plan Discussed with: CRNA  Anesthesia Plan Comments:         Anesthesia Quick Evaluation

## 2020-09-14 NOTE — Anesthesia Postprocedure Evaluation (Signed)
Anesthesia Post Note  Patient: Dalton Thomas.  Procedure(s) Performed: CATARACT EXTRACTION PHACO AND INTRAOCULAR LENS PLACEMENT (IOC) LEFT DIABETIC [place] (Left Eye)     Patient location during evaluation: PACU Anesthesia Type: MAC Level of consciousness: awake and alert Pain management: pain level controlled Vital Signs Assessment: post-procedure vital signs reviewed and stable Respiratory status: spontaneous breathing, nonlabored ventilation, respiratory function stable and patient connected to nasal cannula oxygen Cardiovascular status: stable and blood pressure returned to baseline Postop Assessment: no apparent nausea or vomiting Anesthetic complications: no   No complications documented.  Fidel Levy

## 2020-09-14 NOTE — Anesthesia Procedure Notes (Signed)
Procedure Name: MAC Date/Time: 09/14/2020 7:43 AM Performed by: Dionne Bucy, CRNA Pre-anesthesia Checklist: Patient identified, Emergency Drugs available, Suction available, Patient being monitored and Timeout performed Patient Re-evaluated:Patient Re-evaluated prior to induction Oxygen Delivery Method: Nasal cannula Placement Confirmation: positive ETCO2

## 2020-09-14 NOTE — H&P (Signed)
[facility]   Primary Care Physician:  Koren Bound, NP Ophthalmologist: Dr. Lockie Mola  Pre-Procedure History & Physical: HPI:  Dalton Fuqua. is a 65 y.o. male here for ophthalmic surgery.   Past Medical History:  Diagnosis Date  . Anemia    iron deficiency  . Asthma   . Barrett's esophagus   . Depression   . Diabetes mellitus without complication (HCC)   . Dysphagia   . GERD (gastroesophageal reflux disease)   . Gout   . H/O chronic pancreatitis   . Hyperlipidemia   . Hypertension   . Hypothyroidism   . Hypouricemia   . Insomnia   . Neuromuscular disorder (HCC)   . Obstructive sleep apnea   . Paranoid schizophrenia (HCC)   . Paranoid schizophrenia (HCC)   . Psychogenic polydipsia   . Rosacea   . Schizophrenia (HCC)   . Shortness of breath dyspnea   . Sleep apnea   . Splenic vein thrombosis   . Varices, esophageal (HCC)   . Vitamin D deficiency     Past Surgical History:  Procedure Laterality Date  . CATARACT EXTRACTION W/PHACO Right 09/07/2014   Procedure: CATARACT EXTRACTION PHACO AND INTRAOCULAR LENS PLACEMENT (IOC);  Surgeon: Galen Manila, MD;  Location: [facility] ORS;  Service: Ophthalmology;  Laterality: Right;  [place] 02:16 AP% 24.8 CDE 33.94  . COLONOSCOPY    . COLONOSCOPY N/A 07/16/2016   Procedure: COLONOSCOPY;  Surgeon: Christena Deem, MD;  Location: [facility] ENDOSCOPY;  Service: Endoscopy;  Laterality: N/A;  . COLONOSCOPY WITH PROPOFOL N/A 05/07/2016   Procedure: COLONOSCOPY WITH PROPOFOL;  Surgeon: Christena Deem, MD;  Location: [facility] ENDOSCOPY;  Service: Endoscopy;  Laterality: N/A;  . ESOPHAGOGASTRODUODENOSCOPY    . ESOPHAGOGASTRODUODENOSCOPY (EGD) WITH PROPOFOL N/A 05/07/2016   Procedure: ESOPHAGOGASTRODUODENOSCOPY (EGD) WITH PROPOFOL;  Surgeon: Christena Deem, MD;  Location: [facility] ENDOSCOPY;  Service: Endoscopy;  Laterality: N/A;  . EYE SURGERY    . TONSILLECTOMY      Prior to Admission medications   Medication Sig Start  Date End Date Taking? Authorizing Provider  acetaminophen (TYLENOL) 325 MG tablet Take 650 mg by mouth every 4 (four) hours as needed.    Yes [provider]  alum & mag hydroxide-simeth (MAALOX/MYLANTA) 200-200-20 MG/5ML suspension Take by mouth every 6 (six) hours as needed for indigestion or heartburn.   Yes [provider]  aspirin EC 81 MG tablet Take 81 mg by mouth daily.   Yes [provider]  bismuth subsalicylate (PEPTO BISMOL) 262 MG/15ML suspension Take 30 mLs by mouth every 6 (six) hours as needed.   Yes [provider]  cetirizine (ZYRTEC) 10 MG tablet Take 10 mg by mouth daily.   Yes [provider]  Clozapine 150 MG TBDP Take by mouth daily.   Yes [provider]  guaiFENesin (ROBITUSSIN) 100 MG/5ML liquid Take 200 mg by mouth every 6 (six) hours as needed for cough.   Yes [provider]  Iloperidone 8 MG TABS Take by mouth 2 (two) times daily.   Yes [provider]  Insulin Glargine 300 UNIT/ML SOPN Inject 33 Units into the skin daily after breakfast.    Yes [provider]  levothyroxine (SYNTHROID, LEVOTHROID) 75 MCG tablet Take 75 mcg by mouth daily before breakfast.   Yes [provider]  loperamide (IMODIUM) 2 MG capsule Take 2 mg by mouth as needed for diarrhea or loose stools.   Yes [provider]  losartan (COZAAR) 100 MG  tablet Take 100 mg by mouth daily.   Yes [provider]  losartan (COZAAR) 50 MG tablet Take 50 mg by mouth daily.   Yes [provider]  Magnesium Hydroxide (MILK OF MAGNESIA PO) Take by mouth. Take 2 tbsp as needed for 1 dose   Yes [provider]  Melatonin 5 MG CAPS Take by mouth.   Yes [provider]  metFORMIN (GLUCOPHAGE) 1000 MG tablet Take 1,000 mg by mouth daily with breakfast. Two tablets by mouth each morning   Yes [provider]  metoprolol (LOPRESSOR) 50 MG tablet Take 50 mg by mouth daily.    Yes  [provider]  metroNIDAZOLE (METROGEL) 0.75 % gel Apply 1 application topically daily.    Yes [provider]  Multiple Vitamin (THEREMS PO) Take by mouth daily at 2 PM.   Yes [provider]  oral electrolytes (THERMOTABS) TABS tablet Take 1 tablet by mouth 2 (two) times daily.    Yes [provider]  OXYGEN Inhale into the lungs. If needed o2 drops below 90%    Yes [provider]  Pancrelipase, Lip-Prot-Amyl, (CREON PO) Take 2 capsules by mouth QID.   Yes [provider]  pantoprazole (PROTONIX) 40 MG tablet Take 40 mg by mouth daily.   Yes [provider]  polyethylene glycol (MIRALAX / GLYCOLAX) packet Take 17 g by mouth 2 (two) times daily.   Yes [provider]  simethicone (MYLICON) 125 MG chewable tablet Chew 125 mg by mouth every 6 (six) hours as needed for flatulence.   Yes [provider]  simethicone (MYLICON) 80 MG chewable tablet Chew 80 mg by mouth every 6 (six) hours as needed for flatulence.   Yes [provider]  simvastatin (ZOCOR) 20 MG tablet Take 20 mg by mouth daily.   Yes [provider]  sitaGLIPtin (JANUVIA) 100 MG tablet Take 100 mg by mouth daily.   Yes [provider]  traZODone (DESYREL) 50 MG tablet Take 50 mg by mouth at bedtime.   Yes [provider]  insulin lispro protamine-lispro (HUMALOG 50/50 MIX) (50-50) 100 UNIT/ML SUSP injection Inject 2 Units into the skin. Use per sliding scale    [provider]    Allergies as of 07/01/2020 - Review Complete 02/08/2020  Allergen Reaction Noted  . Thorazine [chlorpromazine]  11/29/2015    Family History  Problem Relation Age of Onset  . Diabetes Maternal Grandfather     Social History   Socioeconomic History  . Marital status: Single    Spouse name: Not on file  . Number of children: Not on file  . Years of education: Not on file  . Highest education level: Not on file   Occupational History  . Not on file  Tobacco Use  . Smoking status: Never Smoker  . Smokeless tobacco: Never Used  Vaping Use  . Vaping Use: Never used  Substance and Sexual Activity  . Alcohol use: No  . Drug use: No  . Sexual activity: Not on file  Other Topics Concern  . Not on file  Social History Narrative  . Not on file   Social Determinants of Health   Financial Resource Strain: Not on file  Food Insecurity: Not on file  Transportation Needs: Not on file  Physical Activity: Not on file  Stress: Not on file  Social Connections: Not on file  Intimate Partner Violence: Not on file    Review of Systems: See HPI, otherwise  negative ROS  Physical Exam: BP 100/61   Pulse 88   Temp 97.8 F (36.6 C) (Temporal)   Resp 20   Ht 6' (1.829 m)   Wt 80 kg   SpO2 97%   BMI 23.92 kg/m  General:   Alert,  pleasant and cooperative in NAD Head:  Normocephalic and atraumatic. Lungs:  Clear to auscultation.    Heart:  Regular rate and rhythm.   Impression/Plan: Dalton Hammed. is here for ophthalmic surgery.  Risks, benefits, limitations, and alternatives regarding ophthalmic surgery have been reviewed with the patient.  Questions have been answered.  All parties agreeable.   Lockie Mola, MD  09/14/2020, 7:33 AM

## 2020-09-14 NOTE — Op Note (Signed)
OPERATIVE NOTE  Dalton Thomas [id number] 09/14/2020  PREOPERATIVE DIAGNOSIS:   Nuclear sclerotic cataract left eye with miotic pupil      H25.12   POSTOPERATIVE DIAGNOSIS:   Nuclear sclerotic cataract left eye with miotic pupil.     PROCEDURE:  Phacoemulsification with posterior chamber intraocular lens implantation of the left eye which required pupil stretching with the Malyugin pupil expansion device and capsular staining with Vision Blue Dye  Ultrasound time: Procedure(s) with comments: CATARACT EXTRACTION PHACO AND INTRAOCULAR LENS PLACEMENT (IOC) LEFT DIABETIC MALYUGIN VISION BLUE (Left) - 19.18 1:49.1 17.5%  LENS:   Implant Name Type Inv. Item Serial No. Manufacturer Lot No. LRB No. Used Action  LENS IOL DIOP 19.0 - [id number] Intraocular Lens LENS IOL DIOP 19.0 [id number] JOHNSON   Left 1 Implanted           SURGEON:  Deirdre Evener, MD   ANESTHESIA: Topical with tetracaine drops and 2% Xylocaine jelly, augmented with 1% preservative-free intracameral lidocaine.   COMPLICATIONS:  None.   DESCRIPTION OF PROCEDURE:  The patient was identified in the holding room and transported to the operating room and placed in the supine position under the operating microscope.  The left eye was identified as the operative eye and it was prepped and draped in the usual sterile ophthalmic fashion.   A 1 millimeter clear-corneal paracentesis was made at the 1:30 position. Vision Blue dye was used to stain the lens capsule. The anterior chamber was filled with Viscoat viscoelastic.  0.5 ml of preservative-free 1% lidocaine was injected into the anterior chamber.  A 2.4 millimeter keratome was used to make a near-clear corneal incision at the 10:30 position.  A Malyugin pupil expander was then placed through the main incision and into the anterior chamber of the eye.  The edge of the iris was secured on the lip of the pupil expander and it was released, thereby expanding the pupil to  approximately 7 millimeters for completion of the cataract surgery.  Additional Viscoat was placed in the anterior chamber.  A cystotome and capsulorrhexis forceps were used to make a curvilinear capsulorrhexis.   Balanced salt solution was used to hydrodissect and hydrodelineate the lens nucleus.   Phacoemulsification was used in stop and chop fashion to remove the lens, nucleus and epinucleus.  The remaining cortex was aspirated using the irrigation aspiration handpiece.  Additional Provisc was placed into the eye to distend the capsular bag for lens placement.  A lens was then injected into the capsular bag.  The pupil expanding ring was removed using a Kuglen hook and insertion device. The remaining viscoelastic was aspirated from the capsular bag and the anterior chamber.  The anterior chamber was filled with balanced salt solution to inflate to a physiologic pressure.   Wounds were hydrated with balanced salt solution.  The anterior chamber was inflated to a physiologic pressure with balanced salt solution.  No wound leaks were noted. Cefuroxime 0.1 ml of a 10mg /ml solution was injected into the anterior chamber for a dose of 1 mg of intracameral antibiotic at the completion of the case.   Timolol and Brimonidine drops were applied to the eye.  The patient was taken to the recovery room in stable condition without complications of anesthesia or surgery.  Jlen Wintle 09/14/2020, 8:07 AM

## 2020-09-15 ENCOUNTER — Encounter: Payer: Self-pay | Admitting: Ophthalmology

## 2021-01-31 ENCOUNTER — Encounter: Payer: Self-pay | Admitting: Nurse Practitioner

## 2021-01-31 ENCOUNTER — Other Ambulatory Visit: Payer: Self-pay

## 2021-01-31 ENCOUNTER — Encounter (INDEPENDENT_AMBULATORY_CARE_PROVIDER_SITE_OTHER): Payer: Self-pay

## 2021-01-31 ENCOUNTER — Ambulatory Visit: Payer: Medicare Other | Admitting: Nurse Practitioner

## 2021-01-31 VITALS — BP 95/60 | HR 63 | Temp 98.3°F | Resp 16 | Ht 72.0 in | Wt 165.6 lb

## 2021-01-31 DIAGNOSIS — G4710 Hypersomnia, unspecified: Secondary | ICD-10-CM

## 2021-01-31 DIAGNOSIS — G4733 Obstructive sleep apnea (adult) (pediatric): Secondary | ICD-10-CM | POA: Diagnosis not present

## 2021-01-31 DIAGNOSIS — R0602 Shortness of breath: Secondary | ICD-10-CM

## 2021-01-31 NOTE — Progress Notes (Signed)
[facility] [address] [place], [place] [address]  Internal MEDICINE  Office Visit Note  Patient Name: Dalton Thomas  [id number]  [id number]  Date of Service: 01/31/2021  Chief Complaint  Patient presents with   Follow-up    HPI Brack presents for a follow up visit for routine pulmonary follow-up Followed for shortness of breath and hypersomnia Sleep study 2020--AHI 1.8, did not demonstrate OSA Hypersomnia well controlled--will take short naps during the day but majority of the time feels rested, sleeps well at night, care provider does not report any sleep disturbances or loud snoring. He is not on any maintenance inhalers. He denies any more shortness of breath and reports that he has been doing well.  .    Current Medication: Outpatient Encounter Medications as of 01/31/2021  Medication Sig   acetaminophen (TYLENOL) 325 MG tablet Take 650 mg by mouth every 4 (four) hours as needed.    alum & mag hydroxide-simeth (MAALOX/MYLANTA) 200-200-20 MG/5ML suspension Take by mouth every 6 (six) hours as needed for indigestion or heartburn.   aspirin EC 81 MG tablet Take 81 mg by mouth daily.   bismuth subsalicylate (PEPTO BISMOL) 262 MG/15ML suspension Take 30 mLs by mouth every 6 (six) hours as needed.   cetirizine (ZYRTEC) 10 MG tablet Take 10 mg by mouth daily.   Clozapine 150 MG TBDP Take by mouth daily.   guaiFENesin (ROBITUSSIN) 100 MG/5ML liquid Take 200 mg by mouth every 6 (six) hours as needed for cough.   Iloperidone 8 MG TABS Take by mouth 2 (two) times daily.   Insulin Glargine 300 UNIT/ML SOPN Inject 33 Units into the skin daily after breakfast.    insulin lispro protamine-lispro (HUMALOG 50/50 MIX) (50-50) 100 UNIT/ML SUSP injection Inject 2 Units into the skin. Use per sliding scale   levothyroxine (SYNTHROID, LEVOTHROID) 75 MCG tablet Take 75 mcg by mouth daily before breakfast.   loperamide (IMODIUM) 2 MG capsule Take 2 mg by mouth as needed for  diarrhea or loose stools.   losartan (COZAAR) 100 MG tablet Take 100 mg by mouth daily.   losartan (COZAAR) 50 MG tablet Take 50 mg by mouth daily.   Magnesium Hydroxide (MILK OF MAGNESIA PO) Take by mouth. Take 2 tbsp as needed for 1 dose   Melatonin 5 MG CAPS Take by mouth.   metFORMIN (GLUCOPHAGE) 1000 MG tablet Take 1,000 mg by mouth daily with breakfast. Two tablets by mouth each morning   metoprolol (LOPRESSOR) 50 MG tablet Take 50 mg by mouth daily.    Multiple Vitamin (THEREMS PO) Take by mouth daily at 2 PM.   neomycin-bacitracin-polymyxin (NEOSPORIN) ophthalmic ointment 1 application 4 (four) times daily.   oral electrolytes (THERMOTABS) TABS tablet Take 1 tablet by mouth 2 (two) times daily.    OXYGEN Inhale into the lungs. If needed o2 drops below 90%    Pancrelipase, Lip-Prot-Amyl, (CREON PO) Take 2 capsules by mouth QID.   pantoprazole (PROTONIX) 40 MG tablet Take 40 mg by mouth daily.   polyethylene glycol (MIRALAX / GLYCOLAX) packet Take 17 g by mouth 2 (two) times daily.   simethicone (MYLICON) 125 MG chewable tablet Chew 180 mg by mouth every 6 (six) hours as needed for flatulence.   simethicone (MYLICON) 80 MG chewable tablet Chew 80 mg by mouth every 6 (six) hours as needed for flatulence.   simvastatin (ZOCOR) 20 MG tablet Take 20 mg by mouth daily.   sitaGLIPtin (JANUVIA) 100 MG tablet Take 100  mg by mouth daily.   traZODone (DESYREL) 50 MG tablet Take 50 mg by mouth at bedtime.   metroNIDAZOLE (METROGEL) 0.75 % gel Apply 1 application topically daily.  (Patient not taking: Reported on 01/31/2021)   No facility-administered encounter medications on file as of 01/31/2021.    Surgical History: Past Surgical History:  Procedure Laterality Date   CATARACT EXTRACTION W/PHACO Right 09/07/2014   Procedure: CATARACT EXTRACTION PHACO AND INTRAOCULAR LENS PLACEMENT (IOC);  Surgeon: Galen Manila, MD;  Location: [facility] ORS;  Service: Ophthalmology;  Laterality: Right;  [place]  02:16 AP% 24.8 CDE 33.94   CATARACT EXTRACTION W/PHACO Left 09/14/2020   Procedure: CATARACT EXTRACTION PHACO AND INTRAOCULAR LENS PLACEMENT (IOC) LEFT DIABETIC MALYUGIN VISION BLUE;  Surgeon: Lockie Mola, MD;  Location: [facility];  Service: Ophthalmology;  Laterality: Left;  19.18 1:49.1 17.5%   COLONOSCOPY     COLONOSCOPY N/A 07/16/2016   Procedure: COLONOSCOPY;  Surgeon: Christena Deem, MD;  Location: [facility] ENDOSCOPY;  Service: Endoscopy;  Laterality: N/A;   COLONOSCOPY WITH PROPOFOL N/A 05/07/2016   Procedure: COLONOSCOPY WITH PROPOFOL;  Surgeon: Christena Deem, MD;  Location: [facility] ENDOSCOPY;  Service: Endoscopy;  Laterality: N/A;   ESOPHAGOGASTRODUODENOSCOPY     ESOPHAGOGASTRODUODENOSCOPY (EGD) WITH PROPOFOL N/A 05/07/2016   Procedure: ESOPHAGOGASTRODUODENOSCOPY (EGD) WITH PROPOFOL;  Surgeon: Christena Deem, MD;  Location: [facility] ENDOSCOPY;  Service: Endoscopy;  Laterality: N/A;   EYE SURGERY     TONSILLECTOMY      Medical History: Past Medical History:  Diagnosis Date   Anemia    iron deficiency   Asthma    Barrett's esophagus    Depression    Diabetes mellitus without complication (HCC)    Dysphagia    GERD (gastroesophageal reflux disease)    Gout    H/O chronic pancreatitis    Hyperlipidemia    Hypertension    Hypothyroidism    Hypouricemia    Insomnia    Neuromuscular disorder (HCC)    Obstructive sleep apnea    Paranoid schizophrenia (HCC)    Paranoid schizophrenia (HCC)    Psychogenic polydipsia    Rosacea    Schizophrenia (HCC)    Shortness of breath dyspnea    Sleep apnea    Splenic vein thrombosis    Varices, esophageal (HCC)    Vitamin D deficiency     Family History: Family History  Problem Relation Age of Onset   Diabetes Maternal Grandfather     Social History   Socioeconomic History   Marital status: Single    Spouse name: Not on file   Number of children: Not on file   Years of education: Not on file   Highest  education level: Not on file  Occupational History   Not on file  Tobacco Use   Smoking status: Never   Smokeless tobacco: Never  Vaping Use   Vaping Use: Never used  Substance and Sexual Activity   Alcohol use: No   Drug use: No   Sexual activity: Not on file  Other Topics Concern   Not on file  Social History Narrative   Not on file   Social Determinants of Health   Financial Resource Strain: Not on file  Food Insecurity: Not on file  Transportation Needs: Not on file  Physical Activity: Not on file  Stress: Not on file  Social Connections: Not on file  Intimate Partner Violence: Not on file      Review of Systems  Constitutional:  Negative for chills, fatigue  and unexpected weight change.  HENT:  Negative for congestion, rhinorrhea, sneezing and sore throat.   Eyes:  Negative for redness.  Respiratory:  Negative for cough, chest tightness and shortness of breath.   Cardiovascular:  Negative for chest pain and palpitations.  Gastrointestinal:  Negative for abdominal pain, constipation, diarrhea, nausea and vomiting.  Genitourinary:  Negative for dysuria and frequency.  Musculoskeletal:  Negative for arthralgias, back pain, joint swelling and neck pain.  Skin:  Negative for rash.  Neurological: Negative.  Negative for tremors and numbness.  Hematological:  Negative for adenopathy. Does not bruise/bleed easily.  Psychiatric/Behavioral:  Negative for behavioral problems (Depression), sleep disturbance and suicidal ideas. The patient is not nervous/anxious.    Vital Signs: BP 95/60 Comment: 80/56  Pulse 63   Temp 98.3 F (36.8 C)   Resp 16   Ht 6' (1.829 m)   Wt 165 lb 9.6 oz (75.1 kg)   SpO2 98%   BMI 22.46 kg/m    Physical Exam Vitals reviewed.  Constitutional:      Appearance: Normal appearance. He is normal weight.  HENT:     Head: Normocephalic and atraumatic.  Eyes:     Pupils: Pupils are equal, round, and reactive to light.  Cardiovascular:      Rate and Rhythm: Normal rate and regular rhythm.  Pulmonary:     Effort: Pulmonary effort is normal. No respiratory distress.  Neurological:     Mental Status: He is alert and oriented to person, place, and time.  Psychiatric:        Mood and Affect: Mood normal.        Behavior: Behavior normal.       Assessment/Plan: 1. Hypersomnia Well controlled, no issues  2. Shortness of breath Has not had any more SOB   3. Obstructive sleep apnea Not significant enough to qualify for CPAP as of 2020.    General Counseling: Lenora Boys understanding of the findings of todays visit and agrees with plan of treatment. I have discussed any further diagnostic evaluation that may be needed or ordered today. We also reviewed his medications today. he has been encouraged to call the office with any questions or concerns that should arise related to todays visit.    No orders of the defined types were placed in this encounter.   No orders of the defined types were placed in this encounter.   Return in about 1 year (around 01/31/2022) for F/U, pulmonary only with DSK or Izack Hoogland.   Total time spent:20 Minutes Time spent includes review of chart, medications, test results, and follow up plan with the patient.   [place] Controlled Substance Database was reviewed by me.  This patient was seen by Sallyanne Kuster, FNP-C in collaboration with Dr. Beverely Risen as a part of collaborative care agreement.   Tammra Pressman R. Tedd Sias, MSN, FNP-C Internal medicine

## 2022-01-22 ENCOUNTER — Encounter: Payer: Self-pay | Admitting: Physician Assistant

## 2022-01-22 ENCOUNTER — Ambulatory Visit: Payer: Medicare Other | Admitting: Physician Assistant

## 2022-01-22 DIAGNOSIS — J309 Allergic rhinitis, unspecified: Secondary | ICD-10-CM

## 2022-01-22 NOTE — Progress Notes (Signed)
[facility] [place], [place] [address]  Pulmonary Sleep Medicine   Office Visit Note  Patient Name: Dalton Thomas. DOB: [birth year] MRN [id number]  Date of Service: 01/22/2022  Complaints/HPI: Pt is here for routine pulmonary follow up with his caregiver. Not using any inhalers. Does not feel SOB. Feels well rested. Getting more than 8 hours of sleep. He will relax in the chair and may doze if not doing anything, but otherwise feels well rested. His last HST was in 2020 and was negative and his last PFT was normal. Since patient is not complaining of any symptoms and has been stable for years, we discussed if further follow up was really indicated and agreed he could follow up as needed.  ROS  General: (-) fever, (-) chills, (-) night sweats, (-) weakness Skin: (-) rashes, (-) itching,. Eyes: (-) visual changes, (-) redness, (-) itching. Nose and Sinuses: (-) nasal stuffiness or itchiness, (-) postnasal drip, (-) nosebleeds, (-) sinus trouble. Mouth and Throat: (-) sore throat, (-) hoarseness. Neck: (-) swollen glands, (-) enlarged thyroid, (-) neck pain. Respiratory: - cough, (-) bloody sputum, - shortness of breath, - wheezing. Cardiovascular: - ankle swelling, (-) chest pain. Lymphatic: (-) lymph node enlargement. Neurologic: (-) numbness, (-) tingling. Psychiatric: (-) anxiety, (-) depression   Current Medication: Outpatient Encounter Medications as of 01/22/2022  Medication Sig   acetaminophen (TYLENOL) 325 MG tablet Take 650 mg by mouth every 4 (four) hours as needed.    alum & mag hydroxide-simeth (MAALOX/MYLANTA) 200-200-20 MG/5ML suspension Take by mouth every 6 (six) hours as needed for indigestion or heartburn.   aspirin EC 81 MG tablet Take 81 mg by mouth daily.   bismuth subsalicylate (PEPTO BISMOL) 262 MG/15ML suspension Take 30 mLs by mouth every 6 (six) hours as needed.   cetirizine (ZYRTEC) 10 MG tablet Take 10 mg by mouth daily.    Clozapine 150 MG TBDP Take by mouth daily.   guaiFENesin (ROBITUSSIN) 100 MG/5ML liquid Take 200 mg by mouth every 6 (six) hours as needed for cough.   Iloperidone 8 MG TABS Take by mouth 2 (two) times daily.   Insulin Glargine 300 UNIT/ML SOPN Inject 33 Units into the skin daily after breakfast.    insulin lispro protamine-lispro (HUMALOG 50/50 MIX) (50-50) 100 UNIT/ML SUSP injection Inject 2 Units into the skin. Use per sliding scale   levothyroxine (SYNTHROID, LEVOTHROID) 75 MCG tablet Take 75 mcg by mouth daily before breakfast.   loperamide (IMODIUM) 2 MG capsule Take 2 mg by mouth as needed for diarrhea or loose stools.   losartan (COZAAR) 100 MG tablet Take 100 mg by mouth daily.   losartan (COZAAR) 50 MG tablet Take 50 mg by mouth daily.   Magnesium Hydroxide (MILK OF MAGNESIA PO) Take by mouth. Take 2 tbsp as needed for 1 dose   Melatonin 5 MG CAPS Take by mouth.   metFORMIN (GLUCOPHAGE) 1000 MG tablet Take 1,000 mg by mouth daily with breakfast. Two tablets by mouth each morning   metoprolol (LOPRESSOR) 50 MG tablet Take 50 mg by mouth daily.    metroNIDAZOLE (METROGEL) 0.75 % gel Apply 1 application  topically daily.   Multiple Vitamin (THEREMS PO) Take by mouth daily at 2 PM.   neomycin-bacitracin-polymyxin (NEOSPORIN) ophthalmic ointment 1 application 4 (four) times daily.   oral electrolytes (THERMOTABS) TABS tablet Take 1 tablet by mouth 2 (two) times daily.    OXYGEN Inhale into the lungs. If needed o2 drops below  90%    Pancrelipase, Lip-Prot-Amyl, (CREON PO) Take 2 capsules by mouth QID.   pantoprazole (PROTONIX) 40 MG tablet Take 40 mg by mouth daily.   polyethylene glycol (MIRALAX / GLYCOLAX) packet Take 17 g by mouth 2 (two) times daily.   simethicone (MYLICON) 125 MG chewable tablet Chew 180 mg by mouth every 6 (six) hours as needed for flatulence.   simethicone (MYLICON) 80 MG chewable tablet Chew 80 mg by mouth every 6 (six) hours as needed for flatulence.   simvastatin  (ZOCOR) 20 MG tablet Take 20 mg by mouth daily.   sitaGLIPtin (JANUVIA) 100 MG tablet Take 100 mg by mouth daily.   traZODone (DESYREL) 50 MG tablet Take 50 mg by mouth at bedtime.   No facility-administered encounter medications on file as of 01/22/2022.    Surgical History: Past Surgical History:  Procedure Laterality Date   CATARACT EXTRACTION W/PHACO Right 09/07/2014   Procedure: CATARACT EXTRACTION PHACO AND INTRAOCULAR LENS PLACEMENT (IOC);  Surgeon: Galen Manila, MD;  Location: [facility] ORS;  Service: Ophthalmology;  Laterality: Right;  [place] 02:16 AP% 24.8 CDE 33.94   CATARACT EXTRACTION W/PHACO Left 09/14/2020   Procedure: CATARACT EXTRACTION PHACO AND INTRAOCULAR LENS PLACEMENT (IOC) LEFT DIABETIC MALYUGIN VISION BLUE;  Surgeon: Lockie Mola, MD;  Location: [facility];  Service: Ophthalmology;  Laterality: Left;  19.18 1:49.1 17.5%   COLONOSCOPY     COLONOSCOPY N/A 07/16/2016   Procedure: COLONOSCOPY;  Surgeon: Christena Deem, MD;  Location: [facility] ENDOSCOPY;  Service: Endoscopy;  Laterality: N/A;   COLONOSCOPY WITH PROPOFOL N/A 05/07/2016   Procedure: COLONOSCOPY WITH PROPOFOL;  Surgeon: Christena Deem, MD;  Location: [facility] ENDOSCOPY;  Service: Endoscopy;  Laterality: N/A;   ESOPHAGOGASTRODUODENOSCOPY     ESOPHAGOGASTRODUODENOSCOPY (EGD) WITH PROPOFOL N/A 05/07/2016   Procedure: ESOPHAGOGASTRODUODENOSCOPY (EGD) WITH PROPOFOL;  Surgeon: Christena Deem, MD;  Location: [facility] ENDOSCOPY;  Service: Endoscopy;  Laterality: N/A;   EYE SURGERY     TONSILLECTOMY      Medical History: Past Medical History:  Diagnosis Date   Anemia    iron deficiency   Asthma    Barrett's esophagus    Depression    Diabetes mellitus without complication (HCC)    Dysphagia    GERD (gastroesophageal reflux disease)    Gout    H/O chronic pancreatitis    Hyperlipidemia    Hypertension    Hypothyroidism    Hypouricemia    Insomnia    Neuromuscular disorder (HCC)    Obstructive  sleep apnea    Paranoid schizophrenia (HCC)    Paranoid schizophrenia (HCC)    Psychogenic polydipsia    Rosacea    Schizophrenia (HCC)    Shortness of breath dyspnea    Sleep apnea    Splenic vein thrombosis    Varices, esophageal (HCC)    Vitamin D deficiency     Family History: Family History  Problem Relation Age of Onset   Diabetes Maternal Grandfather     Social History: Social History   Socioeconomic History   Marital status: Single    Spouse name: Not on file   Number of children: Not on file   Years of education: Not on file   Highest education level: Not on file  Occupational History   Not on file  Tobacco Use   Smoking status: Never   Smokeless tobacco: Never  Vaping Use   Vaping Use: Never used  Substance and Sexual Activity   Alcohol use: No   Drug use:  No   Sexual activity: Not on file  Other Topics Concern   Not on file  Social History Narrative   Not on file   Social Determinants of Health   Financial Resource Strain: Not on file  Food Insecurity: Not on file  Transportation Needs: Not on file  Physical Activity: Not on file  Stress: Not on file  Social Connections: Not on file  Intimate Partner Violence: Not on file    Vital Signs: Blood pressure 101/86, pulse 83, temperature 98.5 F (36.9 C), resp. rate 16, height 6' (1.829 m), weight 173 lb 9.6 oz (78.7 kg), SpO2 99 %.  Examination: General Appearance: The patient is well-developed, well-nourished, and in no distress. Skin: Gross inspection of skin unremarkable. Head: normocephalic, no gross deformities. Eyes: no gross deformities noted. ENT: ears appear grossly normal no exudates. Neck: Supple. No thyromegaly. No LAD. Respiratory: Lungs clear to auscultation bilaterally. Cardiovascular: Normal S1 and S2 without murmur or rub. Extremities: No cyanosis. pulses are equal. Neurologic: Alert and oriented. No involuntary movements.  LABS: No results found for this or any previous  visit (from the past 2160 hour(s)).  Radiology: No results found.  No results found.  No results found.    Assessment and Plan: Patient Active Problem List   Diagnosis Date Noted   Obstructive sleep apnea     1. Allergic rhinitis, unspecified seasonality, unspecified trigger May continue zyrtec. Denies any SOB or difficulty with his breathing therefore may follow up as needed.   General Counseling: I have discussed the findings of the evaluation and examination with Merlyn Albert.  I have also discussed any further diagnostic evaluation thatmay be needed or ordered today. Nathanuel verbalizes understanding of the findings of todays visit. We also reviewed his medications today and discussed drug interactions and side effects including but not limited excessive drowsiness and altered mental states. We also discussed that there is always a risk not just to him but also people around him. he has been encouraged to call the office with any questions or concerns that should arise related to todays visit.  No orders of the defined types were placed in this encounter.    Time spent: 30  I have personally obtained a history, examined the patient, evaluated laboratory and imaging results, formulated the assessment and plan and placed orders. This patient was seen by Lynn Ito, PA-C in collaboration with Dr. Freda Munro as a part of collaborative care agreement.     Yevonne Pax, MD [facility] Pulmonary and Critical Care Sleep medicine

## 2022-06-13 DIAGNOSIS — I10 Essential (primary) hypertension: Secondary | ICD-10-CM | POA: Diagnosis not present

## 2022-06-26 DIAGNOSIS — I10 Essential (primary) hypertension: Secondary | ICD-10-CM | POA: Diagnosis not present

## 2022-07-02 DIAGNOSIS — G4700 Insomnia, unspecified: Secondary | ICD-10-CM | POA: Diagnosis not present

## 2022-07-02 DIAGNOSIS — K219 Gastro-esophageal reflux disease without esophagitis: Secondary | ICD-10-CM | POA: Diagnosis not present

## 2022-07-02 DIAGNOSIS — E119 Type 2 diabetes mellitus without complications: Secondary | ICD-10-CM | POA: Diagnosis not present

## 2022-07-03 DIAGNOSIS — M9904 Segmental and somatic dysfunction of sacral region: Secondary | ICD-10-CM | POA: Diagnosis not present

## 2022-07-03 DIAGNOSIS — M9903 Segmental and somatic dysfunction of lumbar region: Secondary | ICD-10-CM | POA: Diagnosis not present

## 2022-07-03 DIAGNOSIS — M9905 Segmental and somatic dysfunction of pelvic region: Secondary | ICD-10-CM | POA: Diagnosis not present

## 2022-07-03 DIAGNOSIS — M955 Acquired deformity of pelvis: Secondary | ICD-10-CM | POA: Diagnosis not present

## 2022-07-12 DIAGNOSIS — I10 Essential (primary) hypertension: Secondary | ICD-10-CM | POA: Diagnosis not present

## 2022-07-17 DIAGNOSIS — B351 Tinea unguium: Secondary | ICD-10-CM | POA: Diagnosis not present

## 2022-07-17 DIAGNOSIS — E0840 Diabetes mellitus due to underlying condition with diabetic neuropathy, unspecified: Secondary | ICD-10-CM | POA: Diagnosis not present

## 2022-07-17 DIAGNOSIS — M2041 Other hammer toe(s) (acquired), right foot: Secondary | ICD-10-CM | POA: Diagnosis not present

## 2022-07-17 DIAGNOSIS — L84 Corns and callosities: Secondary | ICD-10-CM | POA: Diagnosis not present

## 2022-07-17 DIAGNOSIS — M79675 Pain in left toe(s): Secondary | ICD-10-CM | POA: Diagnosis not present

## 2022-07-30 DIAGNOSIS — K5901 Slow transit constipation: Secondary | ICD-10-CM | POA: Diagnosis not present

## 2022-07-30 DIAGNOSIS — E119 Type 2 diabetes mellitus without complications: Secondary | ICD-10-CM | POA: Diagnosis not present

## 2022-07-30 DIAGNOSIS — I10 Essential (primary) hypertension: Secondary | ICD-10-CM | POA: Diagnosis not present

## 2022-07-30 DIAGNOSIS — K8681 Exocrine pancreatic insufficiency: Secondary | ICD-10-CM | POA: Diagnosis not present

## 2022-07-31 DIAGNOSIS — M955 Acquired deformity of pelvis: Secondary | ICD-10-CM | POA: Diagnosis not present

## 2022-07-31 DIAGNOSIS — M9903 Segmental and somatic dysfunction of lumbar region: Secondary | ICD-10-CM | POA: Diagnosis not present

## 2022-07-31 DIAGNOSIS — M9904 Segmental and somatic dysfunction of sacral region: Secondary | ICD-10-CM | POA: Diagnosis not present

## 2022-07-31 DIAGNOSIS — M9905 Segmental and somatic dysfunction of pelvic region: Secondary | ICD-10-CM | POA: Diagnosis not present

## 2022-08-15 DIAGNOSIS — Z0189 Encounter for other specified special examinations: Secondary | ICD-10-CM | POA: Diagnosis not present

## 2022-08-27 DIAGNOSIS — K219 Gastro-esophageal reflux disease without esophagitis: Secondary | ICD-10-CM | POA: Diagnosis not present

## 2022-08-27 DIAGNOSIS — E119 Type 2 diabetes mellitus without complications: Secondary | ICD-10-CM | POA: Diagnosis not present

## 2022-08-27 DIAGNOSIS — I10 Essential (primary) hypertension: Secondary | ICD-10-CM | POA: Diagnosis not present

## 2022-08-28 DIAGNOSIS — M955 Acquired deformity of pelvis: Secondary | ICD-10-CM | POA: Diagnosis not present

## 2022-08-28 DIAGNOSIS — M9904 Segmental and somatic dysfunction of sacral region: Secondary | ICD-10-CM | POA: Diagnosis not present

## 2022-08-28 DIAGNOSIS — M9905 Segmental and somatic dysfunction of pelvic region: Secondary | ICD-10-CM | POA: Diagnosis not present

## 2022-08-28 DIAGNOSIS — M9903 Segmental and somatic dysfunction of lumbar region: Secondary | ICD-10-CM | POA: Diagnosis not present

## 2022-09-12 ENCOUNTER — Other Ambulatory Visit: Payer: Self-pay | Admitting: Cardiovascular Disease

## 2022-09-24 DIAGNOSIS — K219 Gastro-esophageal reflux disease without esophagitis: Secondary | ICD-10-CM | POA: Diagnosis not present

## 2022-09-24 DIAGNOSIS — K8681 Exocrine pancreatic insufficiency: Secondary | ICD-10-CM | POA: Diagnosis not present

## 2022-09-24 DIAGNOSIS — E119 Type 2 diabetes mellitus without complications: Secondary | ICD-10-CM | POA: Diagnosis not present

## 2022-09-24 DIAGNOSIS — I10 Essential (primary) hypertension: Secondary | ICD-10-CM | POA: Diagnosis not present

## 2022-10-03 DIAGNOSIS — Z0189 Encounter for other specified special examinations: Secondary | ICD-10-CM | POA: Diagnosis not present

## 2022-10-10 ENCOUNTER — Other Ambulatory Visit: Payer: Self-pay | Admitting: Cardiovascular Disease

## 2022-10-19 DIAGNOSIS — B351 Tinea unguium: Secondary | ICD-10-CM | POA: Diagnosis not present

## 2022-10-19 DIAGNOSIS — M79675 Pain in left toe(s): Secondary | ICD-10-CM | POA: Diagnosis not present

## 2022-10-19 DIAGNOSIS — E0840 Diabetes mellitus due to underlying condition with diabetic neuropathy, unspecified: Secondary | ICD-10-CM | POA: Diagnosis not present

## 2022-10-22 DIAGNOSIS — I10 Essential (primary) hypertension: Secondary | ICD-10-CM | POA: Diagnosis not present

## 2022-10-22 DIAGNOSIS — K5901 Slow transit constipation: Secondary | ICD-10-CM | POA: Diagnosis not present

## 2022-10-22 DIAGNOSIS — E119 Type 2 diabetes mellitus without complications: Secondary | ICD-10-CM | POA: Diagnosis not present

## 2022-10-30 DIAGNOSIS — M9904 Segmental and somatic dysfunction of sacral region: Secondary | ICD-10-CM | POA: Diagnosis not present

## 2022-10-30 DIAGNOSIS — M955 Acquired deformity of pelvis: Secondary | ICD-10-CM | POA: Diagnosis not present

## 2022-10-30 DIAGNOSIS — M9903 Segmental and somatic dysfunction of lumbar region: Secondary | ICD-10-CM | POA: Diagnosis not present

## 2022-10-30 DIAGNOSIS — M9905 Segmental and somatic dysfunction of pelvic region: Secondary | ICD-10-CM | POA: Diagnosis not present

## 2022-11-19 DIAGNOSIS — I10 Essential (primary) hypertension: Secondary | ICD-10-CM | POA: Diagnosis not present

## 2022-11-19 DIAGNOSIS — G4700 Insomnia, unspecified: Secondary | ICD-10-CM | POA: Diagnosis not present

## 2022-11-19 DIAGNOSIS — E119 Type 2 diabetes mellitus without complications: Secondary | ICD-10-CM | POA: Diagnosis not present

## 2022-11-27 DIAGNOSIS — M9905 Segmental and somatic dysfunction of pelvic region: Secondary | ICD-10-CM | POA: Diagnosis not present

## 2022-11-27 DIAGNOSIS — M9904 Segmental and somatic dysfunction of sacral region: Secondary | ICD-10-CM | POA: Diagnosis not present

## 2022-11-27 DIAGNOSIS — M9903 Segmental and somatic dysfunction of lumbar region: Secondary | ICD-10-CM | POA: Diagnosis not present

## 2022-11-27 DIAGNOSIS — M955 Acquired deformity of pelvis: Secondary | ICD-10-CM | POA: Diagnosis not present

## 2022-12-06 DIAGNOSIS — H6123 Impacted cerumen, bilateral: Secondary | ICD-10-CM | POA: Diagnosis not present

## 2022-12-06 DIAGNOSIS — H6063 Unspecified chronic otitis externa, bilateral: Secondary | ICD-10-CM | POA: Diagnosis not present

## 2022-12-17 DIAGNOSIS — K8681 Exocrine pancreatic insufficiency: Secondary | ICD-10-CM | POA: Diagnosis not present

## 2022-12-17 DIAGNOSIS — I10 Essential (primary) hypertension: Secondary | ICD-10-CM | POA: Diagnosis not present

## 2022-12-17 DIAGNOSIS — K219 Gastro-esophageal reflux disease without esophagitis: Secondary | ICD-10-CM | POA: Diagnosis not present

## 2022-12-25 DIAGNOSIS — M9904 Segmental and somatic dysfunction of sacral region: Secondary | ICD-10-CM | POA: Diagnosis not present

## 2022-12-25 DIAGNOSIS — M9905 Segmental and somatic dysfunction of pelvic region: Secondary | ICD-10-CM | POA: Diagnosis not present

## 2022-12-25 DIAGNOSIS — M955 Acquired deformity of pelvis: Secondary | ICD-10-CM | POA: Diagnosis not present

## 2022-12-25 DIAGNOSIS — M9903 Segmental and somatic dysfunction of lumbar region: Secondary | ICD-10-CM | POA: Diagnosis not present

## 2023-01-11 DIAGNOSIS — M79675 Pain in left toe(s): Secondary | ICD-10-CM | POA: Diagnosis not present

## 2023-01-11 DIAGNOSIS — E0840 Diabetes mellitus due to underlying condition with diabetic neuropathy, unspecified: Secondary | ICD-10-CM | POA: Diagnosis not present

## 2023-01-11 DIAGNOSIS — B351 Tinea unguium: Secondary | ICD-10-CM | POA: Diagnosis not present

## 2023-01-14 DIAGNOSIS — K8681 Exocrine pancreatic insufficiency: Secondary | ICD-10-CM | POA: Diagnosis not present

## 2023-01-14 DIAGNOSIS — I10 Essential (primary) hypertension: Secondary | ICD-10-CM | POA: Diagnosis not present

## 2023-01-14 DIAGNOSIS — J019 Acute sinusitis, unspecified: Secondary | ICD-10-CM | POA: Diagnosis not present

## 2023-01-14 DIAGNOSIS — E119 Type 2 diabetes mellitus without complications: Secondary | ICD-10-CM | POA: Diagnosis not present

## 2023-01-22 DIAGNOSIS — M955 Acquired deformity of pelvis: Secondary | ICD-10-CM | POA: Diagnosis not present

## 2023-01-22 DIAGNOSIS — M9905 Segmental and somatic dysfunction of pelvic region: Secondary | ICD-10-CM | POA: Diagnosis not present

## 2023-01-22 DIAGNOSIS — M9904 Segmental and somatic dysfunction of sacral region: Secondary | ICD-10-CM | POA: Diagnosis not present

## 2023-01-22 DIAGNOSIS — M9903 Segmental and somatic dysfunction of lumbar region: Secondary | ICD-10-CM | POA: Diagnosis not present

## 2023-01-23 ENCOUNTER — Telehealth: Payer: Self-pay | Admitting: Internal Medicine

## 2023-01-23 NOTE — Telephone Encounter (Signed)
Lvm to confirm pcp-Toni

## 2023-01-29 DIAGNOSIS — Z0189 Encounter for other specified special examinations: Secondary | ICD-10-CM | POA: Diagnosis not present

## 2023-02-11 DIAGNOSIS — K219 Gastro-esophageal reflux disease without esophagitis: Secondary | ICD-10-CM | POA: Diagnosis not present

## 2023-02-11 DIAGNOSIS — E119 Type 2 diabetes mellitus without complications: Secondary | ICD-10-CM | POA: Diagnosis not present

## 2023-02-11 DIAGNOSIS — I10 Essential (primary) hypertension: Secondary | ICD-10-CM | POA: Diagnosis not present

## 2023-02-17 DIAGNOSIS — R609 Edema, unspecified: Secondary | ICD-10-CM | POA: Diagnosis not present

## 2023-02-19 ENCOUNTER — Encounter: Payer: Self-pay | Admitting: Internal Medicine

## 2023-02-19 ENCOUNTER — Ambulatory Visit: Payer: Medicare Other | Admitting: Internal Medicine

## 2023-02-19 VITALS — BP 112/70 | HR 79 | Temp 98.3°F | Resp 16 | Ht 73.0 in | Wt 172.6 lb

## 2023-02-19 DIAGNOSIS — R0602 Shortness of breath: Secondary | ICD-10-CM

## 2023-02-19 DIAGNOSIS — M9905 Segmental and somatic dysfunction of pelvic region: Secondary | ICD-10-CM | POA: Diagnosis not present

## 2023-02-19 DIAGNOSIS — G4733 Obstructive sleep apnea (adult) (pediatric): Secondary | ICD-10-CM

## 2023-02-19 DIAGNOSIS — M9903 Segmental and somatic dysfunction of lumbar region: Secondary | ICD-10-CM | POA: Diagnosis not present

## 2023-02-19 DIAGNOSIS — M955 Acquired deformity of pelvis: Secondary | ICD-10-CM | POA: Diagnosis not present

## 2023-02-19 DIAGNOSIS — M9904 Segmental and somatic dysfunction of sacral region: Secondary | ICD-10-CM | POA: Diagnosis not present

## 2023-02-19 NOTE — Progress Notes (Signed)
[facility] [address] [place], [place] [address]  Pulmonary Sleep Medicine   Office Visit Note  Patient Name: Leobardo Carico. DOB: 18-May-1955 MRN [id number]  Date of Service: 02/19/2023  Complaints/HPI: He is doing well. He has not had any admissions to the hospital. Not on any inhalers. Had MILD restriction based on last PFT. He should repeat this. Patient is not smoking and no substance use.   Office Spirometry Results:     ROS  General: (-) fever, (-) chills, (-) night sweats, (-) weakness Skin: (-) rashes, (-) itching,. Eyes: (-) visual changes, (-) redness, (-) itching. Nose and Sinuses: (-) nasal stuffiness or itchiness, (-) postnasal drip, (-) nosebleeds, (-) sinus trouble. Mouth and Throat: (-) sore throat, (-) hoarseness. Neck: (-) swollen glands, (-) enlarged thyroid, (-) neck pain. Respiratory: - cough, (-) bloody sputum, - shortness of breath, - wheezing. Cardiovascular: - ankle swelling, (-) chest pain. Lymphatic: (-) lymph node enlargement. Neurologic: (-) numbness, (-) tingling. Psychiatric: (-) anxiety, (-) depression   Current Medication: Outpatient Encounter Medications as of 02/19/2023  Medication Sig   acetaminophen (TYLENOL) 325 MG tablet Take 650 mg by mouth every 4 (four) hours as needed.    alum & mag hydroxide-simeth (MAALOX/MYLANTA) 200-200-20 MG/5ML suspension Take by mouth every 6 (six) hours as needed for indigestion or heartburn.   aspirin EC 81 MG tablet Take 81 mg by mouth daily.   bismuth subsalicylate (PEPTO BISMOL) 262 MG/15ML suspension Take 30 mLs by mouth every 6 (six) hours as needed.   cetirizine (ZYRTEC) 10 MG tablet Take 10 mg by mouth daily.   Clozapine 150 MG TBDP Take by mouth daily.   guaiFENesin (ROBITUSSIN) 100 MG/5ML liquid Take 200 mg by mouth every 6 (six) hours as needed for cough.   Iloperidone 8 MG TABS Take by mouth 2 (two) times daily.   Insulin Glargine 300 UNIT/ML SOPN Inject 33 Units into the  skin daily after breakfast.    insulin lispro protamine-lispro (HUMALOG 50/50 MIX) (50-50) 100 UNIT/ML SUSP injection Inject 2 Units into the skin. Use per sliding scale   levothyroxine (SYNTHROID, LEVOTHROID) 75 MCG tablet Take 75 mcg by mouth daily before breakfast.   loperamide (IMODIUM) 2 MG capsule Take 2 mg by mouth as needed for diarrhea or loose stools.   losartan (COZAAR) 100 MG tablet Take 100 mg by mouth daily.   losartan (COZAAR) 50 MG tablet Take 50 mg by mouth daily.   Magnesium Hydroxide (MILK OF MAGNESIA PO) Take by mouth. Take 2 tbsp as needed for 1 dose   Melatonin 5 MG CAPS Take by mouth.   metFORMIN (GLUCOPHAGE) 1000 MG tablet Take 1,000 mg by mouth daily with breakfast. Two tablets by mouth each morning   metoprolol succinate (TOPROL-XL) 50 MG 24 hr tablet TAKE 1 TABLET BY MOUTH ONCE DAILY   metroNIDAZOLE (METROGEL) 0.75 % gel Apply 1 application  topically daily.   Multiple Vitamin (THEREMS PO) Take by mouth daily at 2 PM.   neomycin-bacitracin-polymyxin (NEOSPORIN) ophthalmic ointment 1 application 4 (four) times daily.   oral electrolytes (THERMOTABS) TABS tablet Take 1 tablet by mouth 2 (two) times daily.    OXYGEN Inhale into the lungs. If needed o2 drops below 90%    Pancrelipase, Lip-Prot-Amyl, (CREON PO) Take 2 capsules by mouth QID.   pantoprazole (PROTONIX) 40 MG tablet Take 40 mg by mouth daily.   polyethylene glycol (MIRALAX / GLYCOLAX) packet Take 17 g by mouth 2 (two) times daily.  simethicone (MYLICON) 125 MG chewable tablet Chew 180 mg by mouth every 6 (six) hours as needed for flatulence.   simethicone (MYLICON) 80 MG chewable tablet Chew 80 mg by mouth every 6 (six) hours as needed for flatulence.   simvastatin (ZOCOR) 20 MG tablet Take 20 mg by mouth daily.   sitaGLIPtin (JANUVIA) 100 MG tablet Take 100 mg by mouth daily.   traZODone (DESYREL) 50 MG tablet Take 50 mg by mouth at bedtime.   No facility-administered encounter medications on file as of  02/19/2023.    Surgical History: Past Surgical History:  Procedure Laterality Date   CATARACT EXTRACTION W/PHACO Right 09/07/2014   Procedure: CATARACT EXTRACTION PHACO AND INTRAOCULAR LENS PLACEMENT (IOC);  Surgeon: Galen Manila, MD;  Location: [facility] ORS;  Service: Ophthalmology;  Laterality: Right;  [place] 02:16 AP% 24.8 CDE 33.94   CATARACT EXTRACTION W/PHACO Left 09/14/2020   Procedure: CATARACT EXTRACTION PHACO AND INTRAOCULAR LENS PLACEMENT (IOC) LEFT DIABETIC MALYUGIN VISION BLUE;  Surgeon: Lockie Mola, MD;  Location: [facility];  Service: Ophthalmology;  Laterality: Left;  19.18 1:49.1 17.5%   COLONOSCOPY     COLONOSCOPY N/A 07/16/2016   Procedure: COLONOSCOPY;  Surgeon: Christena Deem, MD;  Location: [facility] ENDOSCOPY;  Service: Endoscopy;  Laterality: N/A;   COLONOSCOPY WITH PROPOFOL N/A 05/07/2016   Procedure: COLONOSCOPY WITH PROPOFOL;  Surgeon: Christena Deem, MD;  Location: [facility] ENDOSCOPY;  Service: Endoscopy;  Laterality: N/A;   ESOPHAGOGASTRODUODENOSCOPY     ESOPHAGOGASTRODUODENOSCOPY (EGD) WITH PROPOFOL N/A 05/07/2016   Procedure: ESOPHAGOGASTRODUODENOSCOPY (EGD) WITH PROPOFOL;  Surgeon: Christena Deem, MD;  Location: [facility] ENDOSCOPY;  Service: Endoscopy;  Laterality: N/A;   EYE SURGERY     TONSILLECTOMY      Medical History: Past Medical History:  Diagnosis Date   Anemia    iron deficiency   Asthma    Barrett's esophagus    Depression    Diabetes mellitus without complication (HCC)    Dysphagia    GERD (gastroesophageal reflux disease)    Gout    H/O chronic pancreatitis    Hyperlipidemia    Hypertension    Hypothyroidism    Hypouricemia    Insomnia    Neuromuscular disorder (HCC)    Obstructive sleep apnea    Paranoid schizophrenia (HCC)    Paranoid schizophrenia (HCC)    Psychogenic polydipsia    Rosacea    Schizophrenia (HCC)    Shortness of breath dyspnea    Sleep apnea    Splenic vein thrombosis    Varices, esophageal (HCC)     Vitamin D deficiency     Family History: Family History  Problem Relation Age of Onset   Diabetes Maternal Grandfather     Social History: Social History   Socioeconomic History   Marital status: Single    Spouse name: Not on file   Number of children: Not on file   Years of education: Not on file   Highest education level: Not on file  Occupational History   Not on file  Tobacco Use   Smoking status: Never   Smokeless tobacco: Never  Vaping Use   Vaping status: Never Used  Substance and Sexual Activity   Alcohol use: No   Drug use: No   Sexual activity: Not on file  Other Topics Concern   Not on file  Social History Narrative   Not on file   Social Determinants of Health   Financial Resource Strain: Not on file  Food Insecurity: Not on file  Transportation Needs: Not on file  Physical Activity: Not on file  Stress: Not on file  Social Connections: Not on file  Intimate Partner Violence: Not on file    Vital Signs: Blood pressure 112/70, pulse 79, temperature 98.3 F (36.8 C), resp. rate 16, height 6\' 1"  (1.854 m), weight 172 lb 9.6 oz (78.3 kg), SpO2 98%.  Examination: General Appearance: The patient is well-developed, well-nourished, and in no distress. Skin: Gross inspection of skin unremarkable. Head: normocephalic, no gross deformities. Eyes: no gross deformities noted. ENT: ears appear grossly normal no exudates. Neck: Supple. No thyromegaly. No LAD. Respiratory: no rhonchi. Cardiovascular: Normal S1 and S2 without murmur or rub. Extremities: No cyanosis. pulses are equal. Neurologic: Alert and oriented. No involuntary movements.  LABS: No results found for this or any previous visit (from the past 2160 hour(s)).  Radiology: No results found.  No results found.  No results found.  Assessment and Plan: Patient Active Problem List   Diagnosis Date Noted   Obstructive sleep apnea     1. Shortness of breath   Likely recurrence of  aspiration infrequently.  The patient done will continue with current medical management has not been ordered.  2. OSA (obstructive sleep apnea) Not on CPAP   And probably not the real candidate for CPAP.  Patient has not able to tolerate   General Counseling: I have discussed the findings of the evaluation and examination with Merlyn Albert.  I have also discussed any further diagnostic evaluation thatmay be needed or ordered today. Ugonna verbalizes understanding of the findings of todays visit. We also reviewed his medications today and discussed drug interactions and side effects including but not limited excessive drowsiness and altered mental states. We also discussed that there is always a risk not just to him but also people around him. he has been encouraged to call the office with any questions or concerns that should arise related to todays visit.  No orders of the defined types were placed in this encounter.    Time spent: 16  I have personally obtained a history, examined the patient, evaluated laboratory and imaging results, formulated the assessment and plan and placed orders.    Yevonne Pax, MD [facility] Pulmonary and Critical Care Sleep medicine

## 2023-02-23 DIAGNOSIS — D709 Neutropenia, unspecified: Secondary | ICD-10-CM | POA: Diagnosis not present

## 2023-03-11 DIAGNOSIS — I10 Essential (primary) hypertension: Secondary | ICD-10-CM | POA: Diagnosis not present

## 2023-03-11 DIAGNOSIS — E119 Type 2 diabetes mellitus without complications: Secondary | ICD-10-CM | POA: Diagnosis not present

## 2023-03-11 DIAGNOSIS — K219 Gastro-esophageal reflux disease without esophagitis: Secondary | ICD-10-CM | POA: Diagnosis not present

## 2023-03-13 ENCOUNTER — Telehealth: Payer: Self-pay | Admitting: Internal Medicine

## 2023-03-13 NOTE — Telephone Encounter (Signed)
Left vm to confirm 03/20/23 appointment-Toni

## 2023-03-16 DIAGNOSIS — R609 Edema, unspecified: Secondary | ICD-10-CM | POA: Diagnosis not present

## 2023-03-16 DIAGNOSIS — D709 Neutropenia, unspecified: Secondary | ICD-10-CM | POA: Diagnosis not present

## 2023-03-19 DIAGNOSIS — M9903 Segmental and somatic dysfunction of lumbar region: Secondary | ICD-10-CM | POA: Diagnosis not present

## 2023-03-19 DIAGNOSIS — M9904 Segmental and somatic dysfunction of sacral region: Secondary | ICD-10-CM | POA: Diagnosis not present

## 2023-03-19 DIAGNOSIS — M9905 Segmental and somatic dysfunction of pelvic region: Secondary | ICD-10-CM | POA: Diagnosis not present

## 2023-03-19 DIAGNOSIS — M955 Acquired deformity of pelvis: Secondary | ICD-10-CM | POA: Diagnosis not present

## 2023-03-20 ENCOUNTER — Ambulatory Visit: Payer: Medicare Other | Admitting: Internal Medicine

## 2023-03-20 DIAGNOSIS — R0602 Shortness of breath: Secondary | ICD-10-CM | POA: Diagnosis not present

## 2023-03-31 NOTE — Procedures (Signed)
[facility] [address] [place], [address]    Complete Pulmonary Function Testing Interpretation:  FINDINGS:  Forced vital capacity is mildly decreased FEV1 is normal.  Postbronchodilator no significant change noted in FEV1.  FEV1 FVC ratio is increased.  Total lung capacity is mildly decreased.  Residual volume is decreased.  Residual insulin capacity ratio was normal.  FRC is decreased.  DLCO was within normal limits.  IMPRESSION:  This pulmonary function study is consistent with mild restrictive lung disease clinical correlation is recommended  Yevonne Pax, MD [facility] Pulmonary Critical Care Medicine Sleep Medicine

## 2023-04-08 ENCOUNTER — Encounter: Payer: Self-pay | Admitting: Nurse Practitioner

## 2023-04-08 ENCOUNTER — Ambulatory Visit (INDEPENDENT_AMBULATORY_CARE_PROVIDER_SITE_OTHER): Payer: Medicare Other | Admitting: Nurse Practitioner

## 2023-04-08 VITALS — BP 110/64 | HR 73 | Temp 98.2°F | Resp 16 | Ht 73.0 in | Wt 170.4 lb

## 2023-04-08 DIAGNOSIS — G4733 Obstructive sleep apnea (adult) (pediatric): Secondary | ICD-10-CM

## 2023-04-08 DIAGNOSIS — R0602 Shortness of breath: Secondary | ICD-10-CM

## 2023-04-08 DIAGNOSIS — E119 Type 2 diabetes mellitus without complications: Secondary | ICD-10-CM | POA: Diagnosis not present

## 2023-04-08 DIAGNOSIS — I10 Essential (primary) hypertension: Secondary | ICD-10-CM | POA: Diagnosis not present

## 2023-04-08 DIAGNOSIS — K5901 Slow transit constipation: Secondary | ICD-10-CM | POA: Diagnosis not present

## 2023-04-08 NOTE — Progress Notes (Cosign Needed)
[facility] [address] [place], [place] [address]  Internal MEDICINE  Office Visit Note  Patient Name: Dalton Thomas  [id number]  [id number]  Date of Service: 04/08/2023  Chief Complaint  Patient presents with   Follow-up    HPI Dalton Thomas presents for a follow-up visit for SOB and PFT results  Had a PFT done on 03/20/2023, presents today for results of this test.  Recent PFT showed mild restrictive lung disease, this is a decrease in function from his PFT in 2020 which was normal.  He is no longer on CPAP and has no trouble sleeping  Denies any recent SOB, cough, chest tightness or wheezing.     Current Medication: Outpatient Encounter Medications as of 04/08/2023  Medication Sig   acetaminophen (TYLENOL) 325 MG tablet Take 650 mg by mouth every 4 (four) hours as needed.    alum & mag hydroxide-simeth (MAALOX/MYLANTA) 200-200-20 MG/5ML suspension Take by mouth every 6 (six) hours as needed for indigestion or heartburn.   aspirin EC 81 MG tablet Take 81 mg by mouth daily.   bismuth subsalicylate (PEPTO BISMOL) 262 MG/15ML suspension Take 30 mLs by mouth every 6 (six) hours as needed.   cetirizine (ZYRTEC) 10 MG tablet Take 10 mg by mouth daily.   Clozapine 150 MG TBDP Take by mouth daily.   guaiFENesin (ROBITUSSIN) 100 MG/5ML liquid Take 200 mg by mouth every 6 (six) hours as needed for cough.   Iloperidone 8 MG TABS Take by mouth 2 (two) times daily.   Insulin Glargine 300 UNIT/ML SOPN Inject 33 Units into the skin daily after breakfast.    insulin lispro protamine-lispro (HUMALOG 50/50 MIX) (50-50) 100 UNIT/ML SUSP injection Inject 2 Units into the skin. Use per sliding scale   levothyroxine (SYNTHROID, LEVOTHROID) 75 MCG tablet Take 75 mcg by mouth daily before breakfast.   loperamide (IMODIUM) 2 MG capsule Take 2 mg by mouth as needed for diarrhea or loose stools.   losartan (COZAAR) 100 MG tablet Take 100 mg by mouth daily.   losartan (COZAAR) 50 MG tablet  Take 50 mg by mouth daily.   Magnesium Hydroxide (MILK OF MAGNESIA PO) Take by mouth. Take 2 tbsp as needed for 1 dose   Melatonin 5 MG CAPS Take by mouth.   metFORMIN (GLUCOPHAGE) 1000 MG tablet Take 1,000 mg by mouth daily with breakfast. Two tablets by mouth each morning   metoprolol succinate (TOPROL-XL) 50 MG 24 hr tablet TAKE 1 TABLET BY MOUTH ONCE DAILY   metroNIDAZOLE (METROGEL) 0.75 % gel Apply 1 application  topically daily.   Multiple Vitamin (THEREMS PO) Take by mouth daily at 2 PM.   neomycin-bacitracin-polymyxin (NEOSPORIN) ophthalmic ointment 1 application 4 (four) times daily.   oral electrolytes (THERMOTABS) TABS tablet Take 1 tablet by mouth 2 (two) times daily.    OXYGEN Inhale into the lungs. If needed o2 drops below 90%    Pancrelipase, Lip-Prot-Amyl, (CREON PO) Take 2 capsules by mouth QID.   pantoprazole (PROTONIX) 40 MG tablet Take 40 mg by mouth daily.   polyethylene glycol (MIRALAX / GLYCOLAX) packet Take 17 g by mouth 2 (two) times daily.   simethicone (MYLICON) 125 MG chewable tablet Chew 180 mg by mouth every 6 (six) hours as needed for flatulence.   simethicone (MYLICON) 80 MG chewable tablet Chew 80 mg by mouth every 6 (six) hours as needed for flatulence.   simvastatin (ZOCOR) 20 MG tablet Take 20 mg by mouth daily.   sitaGLIPtin (JANUVIA)  100 MG tablet Take 100 mg by mouth daily.   traZODone (DESYREL) 50 MG tablet Take 50 mg by mouth at bedtime.   No facility-administered encounter medications on file as of 04/08/2023.    Surgical History: Past Surgical History:  Procedure Laterality Date   CATARACT EXTRACTION W/PHACO Right 09/07/2014   Procedure: CATARACT EXTRACTION PHACO AND INTRAOCULAR LENS PLACEMENT (IOC);  Surgeon: Galen Manila, MD;  Location: [facility] ORS;  Service: Ophthalmology;  Laterality: Right;  [place] 02:16 AP% 24.8 CDE 33.94   CATARACT EXTRACTION W/PHACO Left 09/14/2020   Procedure: CATARACT EXTRACTION PHACO AND INTRAOCULAR LENS PLACEMENT (IOC)  LEFT DIABETIC MALYUGIN VISION BLUE;  Surgeon: Lockie Mola, MD;  Location: [facility];  Service: Ophthalmology;  Laterality: Left;  19.18 1:49.1 17.5%   COLONOSCOPY     COLONOSCOPY N/A 07/16/2016   Procedure: COLONOSCOPY;  Surgeon: Christena Deem, MD;  Location: [facility] END[facility]COPY;  Service: Endoscopy;  Laterality: N/A;   COLONOSCOPY WITH PROPOFOL N/A 05/07/2016   Procedure: COLONOSCOPY WITH PROPOFOL;  Surgeon: Christena Deem, MD;  Location: [facility] ENDOSCOPY;  Service: Endoscopy;  Laterality: N/A;   ESOPHAGOGASTRODUODENOSCOPY     ESOPHAGOGASTRODUODENOSCOPY (EGD) WITH PROPOFOL N/A 05/07/2016   Procedure: ESOPHAGOGASTRODUODENOSCOPY (EGD) WITH PROPOFOL;  Surgeon: Christena Deem, MD;  Location: [facility] ENDOSCOPY;  Service: Endoscopy;  Laterality: N/A;   EYE SURGERY     TONSILLECTOMY      Medical History: Past Medical History:  Diagnosis Date   Anemia    iron deficiency   Asthma    Barrett's esophagus    Depression    Diabetes mellitus without complication (HCC)    Dysphagia    GERD (gastroesophageal reflux disease)    Gout    H/O chronic pancreatitis    Hyperlipidemia    Hypertension    Hypothyroidism    Hypouricemia    Insomnia    Neuromuscular disorder (HCC)    Obstructive sleep apnea    Paranoid schizophrenia (HCC)    Paranoid schizophrenia (HCC)    Psychogenic polydipsia    Rosacea    Schizophrenia (HCC)    Shortness of breath dyspnea    Sleep apnea    Splenic vein thrombosis    Varices, esophageal (HCC)    Vitamin D deficiency     Family History: Family History  Problem Relation Age of Onset   Diabetes Maternal Grandfather     Social History   Socioeconomic History   Marital status: Single    Spouse name: Not on file   Number of children: Not on file   Years of education: Not on file   Highest education level: Not on file  Occupational History   Not on file  Tobacco Use   Smoking status: Never   Smokeless tobacco: Never  Vaping Use    Vaping status: Never Used  Substance and Sexual Activity   Alcohol use: No   Drug use: No   Sexual activity: Not on file  Other Topics Concern   Not on file  Social History Narrative   Not on file   Social Drivers of Health   Financial Resource Strain: Not on file  Food Insecurity: Not on file  Transportation Needs: Not on file  Physical Activity: Not on file  Stress: Not on file  Social Connections: Not on file  Intimate Partner Violence: Not on file      Review of Systems  Constitutional: Negative.  Negative for fatigue.  Respiratory: Negative.  Negative for cough, chest tightness, shortness of breath and wheezing.  Cardiovascular: Negative.  Negative for chest pain and palpitations.  Musculoskeletal: Negative.   Neurological: Negative.     Vital Signs: BP 110/64   Pulse 73   Temp 98.2 F (36.8 C)   Resp 16   Ht 6\' 1"  (1.854 m)   Wt 170 lb 6.4 oz (77.3 kg)   SpO2 97%   BMI 22.48 kg/m    Physical Exam Vitals reviewed.  Constitutional:      General: He is not in acute distress.    Appearance: Normal appearance. He is normal weight. He is not ill-appearing.  HENT:     Head: Normocephalic and atraumatic.  Eyes:     Pupils: Pupils are equal, round, and reactive to light.  Cardiovascular:     Rate and Rhythm: Normal rate and regular rhythm.     Heart sounds: Normal heart sounds. No murmur heard. Pulmonary:     Effort: Pulmonary effort is normal. No respiratory distress.     Breath sounds: Normal breath sounds. No wheezing.  Neurological:     Mental Status: He is alert and oriented to person, place, and time.  Psychiatric:        Mood and Affect: Mood normal.        Behavior: Behavior normal.        Assessment/Plan: 1. Shortness of breath (Primary) Repeat PFT in December 2025, follow up with results in 1 year - Pulmonary function test; Future  2. OSA (obstructive sleep apnea) resolved   General Counseling: Dalton Thomas verbalizes understanding of the  findings of todays visit and agrees with plan of treatment. I have discussed any further diagnostic evaluation that may be needed or ordered today. We also reviewed his medications today. he has been encouraged to call the office with any questions or concerns that should arise related to todays visit.    No orders of the defined types were placed in this encounter.   No orders of the defined types were placed in this encounter.   Return in about 1 year (around 04/07/2024) for F/U, PFT @ [facility] in 1 year then follow up visit after PFT to discuss results. DSK or Dalton Thomas .   Total time spent:30 Minutes Time spent includes review of chart, medications, test results, and follow up plan with the patient.   [place] Controlled Substance Database was reviewed by me.  This patient was seen by Sallyanne Kuster, FNP-C in collaboration with Dr. Beverely Risen as a part of collaborative care agreement.   Katharina Jehle R. Tedd Sias, MSN, FNP-C Internal medicine

## 2023-04-09 DIAGNOSIS — R0602 Shortness of breath: Secondary | ICD-10-CM | POA: Insufficient documentation

## 2023-04-16 DIAGNOSIS — M955 Acquired deformity of pelvis: Secondary | ICD-10-CM | POA: Diagnosis not present

## 2023-04-16 DIAGNOSIS — M9905 Segmental and somatic dysfunction of pelvic region: Secondary | ICD-10-CM | POA: Diagnosis not present

## 2023-04-16 DIAGNOSIS — M9903 Segmental and somatic dysfunction of lumbar region: Secondary | ICD-10-CM | POA: Diagnosis not present

## 2023-04-16 DIAGNOSIS — M9904 Segmental and somatic dysfunction of sacral region: Secondary | ICD-10-CM | POA: Diagnosis not present

## 2023-04-19 DIAGNOSIS — L84 Corns and callosities: Secondary | ICD-10-CM | POA: Diagnosis not present

## 2023-04-19 DIAGNOSIS — M79675 Pain in left toe(s): Secondary | ICD-10-CM | POA: Diagnosis not present

## 2023-04-19 DIAGNOSIS — B351 Tinea unguium: Secondary | ICD-10-CM | POA: Diagnosis not present

## 2023-04-19 DIAGNOSIS — E0840 Diabetes mellitus due to underlying condition with diabetic neuropathy, unspecified: Secondary | ICD-10-CM | POA: Diagnosis not present

## 2023-04-22 DIAGNOSIS — Z961 Presence of intraocular lens: Secondary | ICD-10-CM | POA: Diagnosis not present

## 2023-04-22 DIAGNOSIS — H26492 Other secondary cataract, left eye: Secondary | ICD-10-CM | POA: Diagnosis not present

## 2023-04-22 DIAGNOSIS — E119 Type 2 diabetes mellitus without complications: Secondary | ICD-10-CM | POA: Diagnosis not present

## 2023-05-05 DIAGNOSIS — I10 Essential (primary) hypertension: Secondary | ICD-10-CM | POA: Diagnosis not present

## 2023-05-05 DIAGNOSIS — R609 Edema, unspecified: Secondary | ICD-10-CM | POA: Diagnosis not present

## 2023-05-05 DIAGNOSIS — E785 Hyperlipidemia, unspecified: Secondary | ICD-10-CM | POA: Diagnosis not present

## 2023-05-05 DIAGNOSIS — E039 Hypothyroidism, unspecified: Secondary | ICD-10-CM | POA: Diagnosis not present

## 2023-05-05 DIAGNOSIS — E119 Type 2 diabetes mellitus without complications: Secondary | ICD-10-CM | POA: Diagnosis not present

## 2023-05-06 DIAGNOSIS — E785 Hyperlipidemia, unspecified: Secondary | ICD-10-CM | POA: Diagnosis not present

## 2023-05-06 DIAGNOSIS — E119 Type 2 diabetes mellitus without complications: Secondary | ICD-10-CM | POA: Diagnosis not present

## 2023-05-06 DIAGNOSIS — I10 Essential (primary) hypertension: Secondary | ICD-10-CM | POA: Diagnosis not present

## 2023-05-14 DIAGNOSIS — M9904 Segmental and somatic dysfunction of sacral region: Secondary | ICD-10-CM | POA: Diagnosis not present

## 2023-05-14 DIAGNOSIS — M9903 Segmental and somatic dysfunction of lumbar region: Secondary | ICD-10-CM | POA: Diagnosis not present

## 2023-05-14 DIAGNOSIS — M955 Acquired deformity of pelvis: Secondary | ICD-10-CM | POA: Diagnosis not present

## 2023-05-14 DIAGNOSIS — M9905 Segmental and somatic dysfunction of pelvic region: Secondary | ICD-10-CM | POA: Diagnosis not present

## 2023-05-21 DIAGNOSIS — H26492 Other secondary cataract, left eye: Secondary | ICD-10-CM | POA: Diagnosis not present

## 2023-06-03 DIAGNOSIS — E785 Hyperlipidemia, unspecified: Secondary | ICD-10-CM | POA: Diagnosis not present

## 2023-06-03 DIAGNOSIS — E119 Type 2 diabetes mellitus without complications: Secondary | ICD-10-CM | POA: Diagnosis not present

## 2023-06-03 DIAGNOSIS — I10 Essential (primary) hypertension: Secondary | ICD-10-CM | POA: Diagnosis not present

## 2023-06-03 DIAGNOSIS — E039 Hypothyroidism, unspecified: Secondary | ICD-10-CM | POA: Diagnosis not present

## 2023-06-11 DIAGNOSIS — M9905 Segmental and somatic dysfunction of pelvic region: Secondary | ICD-10-CM | POA: Diagnosis not present

## 2023-06-11 DIAGNOSIS — M955 Acquired deformity of pelvis: Secondary | ICD-10-CM | POA: Diagnosis not present

## 2023-06-11 DIAGNOSIS — M9903 Segmental and somatic dysfunction of lumbar region: Secondary | ICD-10-CM | POA: Diagnosis not present

## 2023-06-11 DIAGNOSIS — M9904 Segmental and somatic dysfunction of sacral region: Secondary | ICD-10-CM | POA: Diagnosis not present

## 2023-06-24 DIAGNOSIS — D709 Neutropenia, unspecified: Secondary | ICD-10-CM | POA: Diagnosis not present

## 2023-06-24 DIAGNOSIS — E119 Type 2 diabetes mellitus without complications: Secondary | ICD-10-CM | POA: Diagnosis not present

## 2023-06-24 DIAGNOSIS — E785 Hyperlipidemia, unspecified: Secondary | ICD-10-CM | POA: Diagnosis not present

## 2023-06-24 DIAGNOSIS — I10 Essential (primary) hypertension: Secondary | ICD-10-CM | POA: Diagnosis not present

## 2023-06-24 DIAGNOSIS — E039 Hypothyroidism, unspecified: Secondary | ICD-10-CM | POA: Diagnosis not present

## 2023-07-01 DIAGNOSIS — K8681 Exocrine pancreatic insufficiency: Secondary | ICD-10-CM | POA: Diagnosis not present

## 2023-07-01 DIAGNOSIS — E119 Type 2 diabetes mellitus without complications: Secondary | ICD-10-CM | POA: Diagnosis not present

## 2023-07-01 DIAGNOSIS — I10 Essential (primary) hypertension: Secondary | ICD-10-CM | POA: Diagnosis not present

## 2023-07-01 DIAGNOSIS — R631 Polydipsia: Secondary | ICD-10-CM | POA: Diagnosis not present

## 2023-07-05 DIAGNOSIS — L84 Corns and callosities: Secondary | ICD-10-CM | POA: Diagnosis not present

## 2023-07-05 DIAGNOSIS — M79675 Pain in left toe(s): Secondary | ICD-10-CM | POA: Diagnosis not present

## 2023-07-05 DIAGNOSIS — E0840 Diabetes mellitus due to underlying condition with diabetic neuropathy, unspecified: Secondary | ICD-10-CM | POA: Diagnosis not present

## 2023-07-05 DIAGNOSIS — B351 Tinea unguium: Secondary | ICD-10-CM | POA: Diagnosis not present

## 2023-07-09 DIAGNOSIS — M9905 Segmental and somatic dysfunction of pelvic region: Secondary | ICD-10-CM | POA: Diagnosis not present

## 2023-07-09 DIAGNOSIS — M9903 Segmental and somatic dysfunction of lumbar region: Secondary | ICD-10-CM | POA: Diagnosis not present

## 2023-07-09 DIAGNOSIS — M9904 Segmental and somatic dysfunction of sacral region: Secondary | ICD-10-CM | POA: Diagnosis not present

## 2023-07-09 DIAGNOSIS — M955 Acquired deformity of pelvis: Secondary | ICD-10-CM | POA: Diagnosis not present

## 2023-07-29 DIAGNOSIS — I10 Essential (primary) hypertension: Secondary | ICD-10-CM | POA: Diagnosis not present

## 2023-07-29 DIAGNOSIS — K219 Gastro-esophageal reflux disease without esophagitis: Secondary | ICD-10-CM | POA: Diagnosis not present

## 2023-07-29 DIAGNOSIS — E162 Hypoglycemia, unspecified: Secondary | ICD-10-CM | POA: Diagnosis not present

## 2023-07-29 DIAGNOSIS — G4700 Insomnia, unspecified: Secondary | ICD-10-CM | POA: Diagnosis not present

## 2023-08-06 DIAGNOSIS — M955 Acquired deformity of pelvis: Secondary | ICD-10-CM | POA: Diagnosis not present

## 2023-08-06 DIAGNOSIS — M9904 Segmental and somatic dysfunction of sacral region: Secondary | ICD-10-CM | POA: Diagnosis not present

## 2023-08-06 DIAGNOSIS — M9905 Segmental and somatic dysfunction of pelvic region: Secondary | ICD-10-CM | POA: Diagnosis not present

## 2023-08-06 DIAGNOSIS — M9903 Segmental and somatic dysfunction of lumbar region: Secondary | ICD-10-CM | POA: Diagnosis not present

## 2023-08-26 DIAGNOSIS — I10 Essential (primary) hypertension: Secondary | ICD-10-CM | POA: Diagnosis not present

## 2023-08-26 DIAGNOSIS — E119 Type 2 diabetes mellitus without complications: Secondary | ICD-10-CM | POA: Diagnosis not present

## 2023-08-26 DIAGNOSIS — K8681 Exocrine pancreatic insufficiency: Secondary | ICD-10-CM | POA: Diagnosis not present

## 2023-08-26 DIAGNOSIS — R631 Polydipsia: Secondary | ICD-10-CM | POA: Diagnosis not present

## 2023-08-29 ENCOUNTER — Other Ambulatory Visit: Payer: Self-pay | Admitting: Cardiovascular Disease

## 2023-09-03 DIAGNOSIS — M9904 Segmental and somatic dysfunction of sacral region: Secondary | ICD-10-CM | POA: Diagnosis not present

## 2023-09-03 DIAGNOSIS — M9905 Segmental and somatic dysfunction of pelvic region: Secondary | ICD-10-CM | POA: Diagnosis not present

## 2023-09-03 DIAGNOSIS — M955 Acquired deformity of pelvis: Secondary | ICD-10-CM | POA: Diagnosis not present

## 2023-09-03 DIAGNOSIS — M9903 Segmental and somatic dysfunction of lumbar region: Secondary | ICD-10-CM | POA: Diagnosis not present

## 2023-09-23 DIAGNOSIS — I10 Essential (primary) hypertension: Secondary | ICD-10-CM | POA: Diagnosis not present

## 2023-09-23 DIAGNOSIS — E119 Type 2 diabetes mellitus without complications: Secondary | ICD-10-CM | POA: Diagnosis not present

## 2023-09-23 DIAGNOSIS — R631 Polydipsia: Secondary | ICD-10-CM | POA: Diagnosis not present

## 2023-09-23 DIAGNOSIS — K8681 Exocrine pancreatic insufficiency: Secondary | ICD-10-CM | POA: Diagnosis not present

## 2023-10-01 DIAGNOSIS — M955 Acquired deformity of pelvis: Secondary | ICD-10-CM | POA: Diagnosis not present

## 2023-10-01 DIAGNOSIS — M9905 Segmental and somatic dysfunction of pelvic region: Secondary | ICD-10-CM | POA: Diagnosis not present

## 2023-10-01 DIAGNOSIS — M9903 Segmental and somatic dysfunction of lumbar region: Secondary | ICD-10-CM | POA: Diagnosis not present

## 2023-10-01 DIAGNOSIS — M9904 Segmental and somatic dysfunction of sacral region: Secondary | ICD-10-CM | POA: Diagnosis not present

## 2023-10-04 DIAGNOSIS — E0840 Diabetes mellitus due to underlying condition with diabetic neuropathy, unspecified: Secondary | ICD-10-CM | POA: Diagnosis not present

## 2023-10-04 DIAGNOSIS — L84 Corns and callosities: Secondary | ICD-10-CM | POA: Diagnosis not present

## 2023-10-04 DIAGNOSIS — M79675 Pain in left toe(s): Secondary | ICD-10-CM | POA: Diagnosis not present

## 2023-10-04 DIAGNOSIS — B351 Tinea unguium: Secondary | ICD-10-CM | POA: Diagnosis not present

## 2023-11-05 DIAGNOSIS — M9904 Segmental and somatic dysfunction of sacral region: Secondary | ICD-10-CM | POA: Diagnosis not present

## 2023-11-05 DIAGNOSIS — M9903 Segmental and somatic dysfunction of lumbar region: Secondary | ICD-10-CM | POA: Diagnosis not present

## 2023-11-05 DIAGNOSIS — M9905 Segmental and somatic dysfunction of pelvic region: Secondary | ICD-10-CM | POA: Diagnosis not present

## 2023-11-05 DIAGNOSIS — M955 Acquired deformity of pelvis: Secondary | ICD-10-CM | POA: Diagnosis not present

## 2023-11-06 DIAGNOSIS — Z760 Encounter for issue of repeat prescription: Secondary | ICD-10-CM | POA: Diagnosis not present

## 2023-11-15 DIAGNOSIS — I10 Essential (primary) hypertension: Secondary | ICD-10-CM | POA: Diagnosis not present

## 2023-11-15 DIAGNOSIS — E119 Type 2 diabetes mellitus without complications: Secondary | ICD-10-CM | POA: Diagnosis not present

## 2023-11-15 DIAGNOSIS — L719 Rosacea, unspecified: Secondary | ICD-10-CM | POA: Diagnosis not present

## 2023-11-15 DIAGNOSIS — G4700 Insomnia, unspecified: Secondary | ICD-10-CM | POA: Diagnosis not present

## 2023-11-28 DIAGNOSIS — R012 Other cardiac sounds: Secondary | ICD-10-CM | POA: Diagnosis not present

## 2023-11-28 DIAGNOSIS — R011 Cardiac murmur, unspecified: Secondary | ICD-10-CM | POA: Diagnosis not present

## 2023-12-02 DIAGNOSIS — M9903 Segmental and somatic dysfunction of lumbar region: Secondary | ICD-10-CM | POA: Diagnosis not present

## 2023-12-02 DIAGNOSIS — M9905 Segmental and somatic dysfunction of pelvic region: Secondary | ICD-10-CM | POA: Diagnosis not present

## 2023-12-02 DIAGNOSIS — M955 Acquired deformity of pelvis: Secondary | ICD-10-CM | POA: Diagnosis not present

## 2023-12-02 DIAGNOSIS — M9904 Segmental and somatic dysfunction of sacral region: Secondary | ICD-10-CM | POA: Diagnosis not present

## 2023-12-06 DIAGNOSIS — E038 Other specified hypothyroidism: Secondary | ICD-10-CM | POA: Diagnosis not present

## 2023-12-06 DIAGNOSIS — Z79899 Other long term (current) drug therapy: Secondary | ICD-10-CM | POA: Diagnosis not present

## 2023-12-06 DIAGNOSIS — E559 Vitamin D deficiency, unspecified: Secondary | ICD-10-CM | POA: Diagnosis not present

## 2023-12-06 DIAGNOSIS — D519 Vitamin B12 deficiency anemia, unspecified: Secondary | ICD-10-CM | POA: Diagnosis not present

## 2023-12-06 DIAGNOSIS — R7309 Other abnormal glucose: Secondary | ICD-10-CM | POA: Diagnosis not present

## 2023-12-06 DIAGNOSIS — H6063 Unspecified chronic otitis externa, bilateral: Secondary | ICD-10-CM | POA: Diagnosis not present

## 2023-12-06 DIAGNOSIS — H6123 Impacted cerumen, bilateral: Secondary | ICD-10-CM | POA: Diagnosis not present

## 2023-12-06 DIAGNOSIS — E782 Mixed hyperlipidemia: Secondary | ICD-10-CM | POA: Diagnosis not present

## 2023-12-19 DIAGNOSIS — E119 Type 2 diabetes mellitus without complications: Secondary | ICD-10-CM | POA: Diagnosis not present

## 2023-12-19 DIAGNOSIS — E611 Iron deficiency: Secondary | ICD-10-CM | POA: Diagnosis not present

## 2023-12-19 DIAGNOSIS — G4700 Insomnia, unspecified: Secondary | ICD-10-CM | POA: Diagnosis not present

## 2023-12-19 DIAGNOSIS — L719 Rosacea, unspecified: Secondary | ICD-10-CM | POA: Diagnosis not present

## 2023-12-19 DIAGNOSIS — I10 Essential (primary) hypertension: Secondary | ICD-10-CM | POA: Diagnosis not present

## 2023-12-19 DIAGNOSIS — E038 Other specified hypothyroidism: Secondary | ICD-10-CM | POA: Diagnosis not present

## 2023-12-30 DIAGNOSIS — Z79899 Other long term (current) drug therapy: Secondary | ICD-10-CM | POA: Diagnosis not present

## 2023-12-31 DIAGNOSIS — R945 Abnormal results of liver function studies: Secondary | ICD-10-CM | POA: Diagnosis not present

## 2023-12-31 DIAGNOSIS — E782 Mixed hyperlipidemia: Secondary | ICD-10-CM | POA: Diagnosis not present

## 2023-12-31 DIAGNOSIS — E038 Other specified hypothyroidism: Secondary | ICD-10-CM | POA: Diagnosis not present

## 2023-12-31 DIAGNOSIS — M955 Acquired deformity of pelvis: Secondary | ICD-10-CM | POA: Diagnosis not present

## 2023-12-31 DIAGNOSIS — I10 Essential (primary) hypertension: Secondary | ICD-10-CM | POA: Diagnosis not present

## 2023-12-31 DIAGNOSIS — E559 Vitamin D deficiency, unspecified: Secondary | ICD-10-CM | POA: Diagnosis not present

## 2023-12-31 DIAGNOSIS — M9904 Segmental and somatic dysfunction of sacral region: Secondary | ICD-10-CM | POA: Diagnosis not present

## 2023-12-31 DIAGNOSIS — R0989 Other specified symptoms and signs involving the circulatory and respiratory systems: Secondary | ICD-10-CM | POA: Diagnosis not present

## 2023-12-31 DIAGNOSIS — R944 Abnormal results of kidney function studies: Secondary | ICD-10-CM | POA: Diagnosis not present

## 2023-12-31 DIAGNOSIS — M9905 Segmental and somatic dysfunction of pelvic region: Secondary | ICD-10-CM | POA: Diagnosis not present

## 2023-12-31 DIAGNOSIS — D519 Vitamin B12 deficiency anemia, unspecified: Secondary | ICD-10-CM | POA: Diagnosis not present

## 2023-12-31 DIAGNOSIS — R296 Repeated falls: Secondary | ICD-10-CM | POA: Diagnosis not present

## 2023-12-31 DIAGNOSIS — M9903 Segmental and somatic dysfunction of lumbar region: Secondary | ICD-10-CM | POA: Diagnosis not present

## 2024-01-17 DIAGNOSIS — E038 Other specified hypothyroidism: Secondary | ICD-10-CM | POA: Diagnosis not present

## 2024-01-17 DIAGNOSIS — E119 Type 2 diabetes mellitus without complications: Secondary | ICD-10-CM | POA: Diagnosis not present

## 2024-01-17 DIAGNOSIS — I10 Essential (primary) hypertension: Secondary | ICD-10-CM | POA: Diagnosis not present

## 2024-01-17 DIAGNOSIS — L719 Rosacea, unspecified: Secondary | ICD-10-CM | POA: Diagnosis not present

## 2024-01-17 DIAGNOSIS — G4700 Insomnia, unspecified: Secondary | ICD-10-CM | POA: Diagnosis not present

## 2024-01-27 ENCOUNTER — Other Ambulatory Visit

## 2024-01-27 DIAGNOSIS — Z79899 Other long term (current) drug therapy: Secondary | ICD-10-CM | POA: Diagnosis not present

## 2024-01-28 DIAGNOSIS — M9904 Segmental and somatic dysfunction of sacral region: Secondary | ICD-10-CM | POA: Diagnosis not present

## 2024-01-28 DIAGNOSIS — M9903 Segmental and somatic dysfunction of lumbar region: Secondary | ICD-10-CM | POA: Diagnosis not present

## 2024-01-28 DIAGNOSIS — M955 Acquired deformity of pelvis: Secondary | ICD-10-CM | POA: Diagnosis not present

## 2024-01-28 DIAGNOSIS — M9905 Segmental and somatic dysfunction of pelvic region: Secondary | ICD-10-CM | POA: Diagnosis not present

## 2024-02-11 ENCOUNTER — Telehealth: Payer: Self-pay | Admitting: Internal Medicine

## 2024-02-11 NOTE — Telephone Encounter (Signed)
 Brother called stating he has moved patient closer to his home. Will no longer be coming here for appointments-Toni

## 2024-03-18 ENCOUNTER — Encounter: Payer: Medicare Other | Admitting: Internal Medicine

## 2024-03-30 ENCOUNTER — Ambulatory Visit: Payer: Medicare Other | Admitting: Nurse Practitioner

## 2024-04-21 NOTE — Progress Notes (Signed)
 Dalton Thomas.                                          MRN: [id number]   04/21/2024   The [facility] Quality Team Specialist reviewed this patient medical record for the purposes of chart review for care gap closure. The following were reviewed: chart review for care gap closure-glycemic status assessment.    [facility] Quality Team

## 2024-05-08 ENCOUNTER — Encounter: Payer: Self-pay | Admitting: *Deleted

## 2024-05-08 NOTE — Progress Notes (Signed)
 Dalton Thomas.                                          MRN: [id number]   05/08/2024   The [facility] Quality Team Specialist reviewed this patient medical record for the purposes of chart review for care gap closure. The following were reviewed: chart review for care gap closure-glycemic status assessment.    [facility] Quality Team
